# Patient Record
Sex: Male | Born: 1945
Health system: Southern US, Community
[De-identification: ages and names within clinical notes are randomized; demographics above are authoritative.]

## PROBLEM LIST (undated history)

## (undated) DIAGNOSIS — E7800 Pure hypercholesterolemia, unspecified: Secondary | ICD-10-CM

## (undated) DIAGNOSIS — E039 Hypothyroidism, unspecified: Secondary | ICD-10-CM

## (undated) DIAGNOSIS — Z8739 Personal history of other diseases of the musculoskeletal system and connective tissue: Secondary | ICD-10-CM

## (undated) DIAGNOSIS — J189 Pneumonia, unspecified organism: Secondary | ICD-10-CM

## (undated) DIAGNOSIS — M549 Dorsalgia, unspecified: Secondary | ICD-10-CM

## (undated) DIAGNOSIS — Z9289 Personal history of other medical treatment: Secondary | ICD-10-CM

## (undated) DIAGNOSIS — F329 Major depressive disorder, single episode, unspecified: Secondary | ICD-10-CM

## (undated) DIAGNOSIS — R296 Repeated falls: Secondary | ICD-10-CM

## (undated) DIAGNOSIS — I10 Essential (primary) hypertension: Secondary | ICD-10-CM

## (undated) DIAGNOSIS — F1021 Alcohol dependence, in remission: Secondary | ICD-10-CM

## (undated) DIAGNOSIS — I609 Nontraumatic subarachnoid hemorrhage, unspecified: Secondary | ICD-10-CM

## (undated) DIAGNOSIS — M546 Pain in thoracic spine: Secondary | ICD-10-CM

## (undated) DIAGNOSIS — F419 Anxiety disorder, unspecified: Secondary | ICD-10-CM

## (undated) DIAGNOSIS — K759 Inflammatory liver disease, unspecified: Secondary | ICD-10-CM

## (undated) DIAGNOSIS — M1990 Unspecified osteoarthritis, unspecified site: Secondary | ICD-10-CM

## (undated) DIAGNOSIS — I209 Angina pectoris, unspecified: Secondary | ICD-10-CM

## (undated) DIAGNOSIS — R202 Paresthesia of skin: Secondary | ICD-10-CM

## (undated) DIAGNOSIS — R0602 Shortness of breath: Secondary | ICD-10-CM

## (undated) DIAGNOSIS — Z720 Tobacco use: Secondary | ICD-10-CM

## (undated) DIAGNOSIS — G8929 Other chronic pain: Secondary | ICD-10-CM

## (undated) DIAGNOSIS — F32A Depression, unspecified: Secondary | ICD-10-CM

## (undated) DIAGNOSIS — R51 Headache: Secondary | ICD-10-CM

## (undated) DIAGNOSIS — R200 Anesthesia of skin: Secondary | ICD-10-CM

## (undated) DIAGNOSIS — R350 Frequency of micturition: Secondary | ICD-10-CM

## (undated) DIAGNOSIS — E119 Type 2 diabetes mellitus without complications: Secondary | ICD-10-CM

## (undated) DIAGNOSIS — R519 Headache, unspecified: Secondary | ICD-10-CM

## (undated) HISTORY — PX: EYE SURGERY: SHX253

## (undated) HISTORY — PX: CATARACT EXTRACTION: SUR2

---
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
## 1969-08-09 HISTORY — PX: CYSTOSCOPY: SHX5120

## 2011-06-08 ENCOUNTER — Inpatient Hospital Stay (HOSPITAL_COMMUNITY)
Admission: EM | Admit: 2011-06-08 | Discharge: 2011-06-11 | DRG: 313 | Discharge status: Against Medical Advice | Payer: Medicare Other | Attending: Family Medicine | Admitting: Family Medicine

## 2011-06-08 ENCOUNTER — Emergency Department (HOSPITAL_COMMUNITY): Payer: Medicare Other

## 2011-06-08 DIAGNOSIS — E039 Hypothyroidism, unspecified: Secondary | ICD-10-CM | POA: Diagnosis present

## 2011-06-08 DIAGNOSIS — E785 Hyperlipidemia, unspecified: Secondary | ICD-10-CM | POA: Diagnosis present

## 2011-06-08 DIAGNOSIS — E876 Hypokalemia: Secondary | ICD-10-CM | POA: Diagnosis present

## 2011-06-08 DIAGNOSIS — F17200 Nicotine dependence, unspecified, uncomplicated: Secondary | ICD-10-CM | POA: Diagnosis present

## 2011-06-08 DIAGNOSIS — F1210 Cannabis abuse, uncomplicated: Secondary | ICD-10-CM | POA: Diagnosis present

## 2011-06-08 DIAGNOSIS — Z8249 Family history of ischemic heart disease and other diseases of the circulatory system: Secondary | ICD-10-CM

## 2011-06-08 DIAGNOSIS — R5381 Other malaise: Secondary | ICD-10-CM | POA: Diagnosis present

## 2011-06-08 DIAGNOSIS — R5383 Other fatigue: Secondary | ICD-10-CM | POA: Diagnosis present

## 2011-06-08 DIAGNOSIS — N400 Enlarged prostate without lower urinary tract symptoms: Secondary | ICD-10-CM | POA: Diagnosis present

## 2011-06-08 DIAGNOSIS — N289 Disorder of kidney and ureter, unspecified: Secondary | ICD-10-CM | POA: Diagnosis present

## 2011-06-08 DIAGNOSIS — F1010 Alcohol abuse, uncomplicated: Secondary | ICD-10-CM | POA: Diagnosis present

## 2011-06-08 DIAGNOSIS — R0789 Other chest pain: Principal | ICD-10-CM | POA: Diagnosis present

## 2011-06-08 DIAGNOSIS — R079 Chest pain, unspecified: Secondary | ICD-10-CM

## 2011-06-08 DIAGNOSIS — I10 Essential (primary) hypertension: Secondary | ICD-10-CM | POA: Diagnosis present

## 2011-06-08 LAB — CBC
MCH: 32.9 pg (ref 26.0–34.0)
MCHC: 35.1 g/dL (ref 30.0–36.0)
Platelets: 191 10*3/uL (ref 150–400)

## 2011-06-08 LAB — POCT I-STAT, CHEM 8
Calcium, Ion: 1.08 mmol/L — ABNORMAL LOW (ref 1.12–1.32)
Creatinine, Ser: 1.6 mg/dL — ABNORMAL HIGH (ref 0.50–1.35)
Glucose, Bld: 260 mg/dL — ABNORMAL HIGH (ref 70–99)
Hemoglobin: 13.9 g/dL (ref 13.0–17.0)
TCO2: 19 mmol/L (ref 0–100)

## 2011-06-08 LAB — TROPONIN I: Troponin I: <0.3 ng/mL (ref <0.30)

## 2011-06-08 MED ORDER — IOHEXOL 350 MG/ML SOLN
80.0000 mL | Freq: Once | INTRAVENOUS | Status: AC | PRN
  Administered 2011-06-08: 80 mL via INTRAVENOUS

## 2011-06-09 ENCOUNTER — Inpatient Hospital Stay (HOSPITAL_COMMUNITY): Payer: Medicare Other

## 2011-06-09 LAB — CARDIAC PANEL(CRET KIN+CKTOT+MB+TROPI)
CK, MB: 2.9 ng/mL (ref 0.3–4.0)
Relative Index: 1.6 (ref 0.0–2.5)
Relative Index: 1.7 (ref 0.0–2.5)
Troponin I: <0.3 ng/mL (ref <0.30)
Troponin I: <0.3 ng/mL (ref <0.30)

## 2011-06-09 LAB — HEMOGLOBIN A1C: Hgb A1c MFr Bld: 6 % — ABNORMAL HIGH (ref <5.7)

## 2011-06-09 LAB — CBC
HCT: 36 % — ABNORMAL LOW (ref 39.0–52.0)
Hemoglobin: 13 g/dL (ref 13.0–17.0)
MCHC: 36.1 g/dL — ABNORMAL HIGH (ref 30.0–36.0)

## 2011-06-09 LAB — COMPREHENSIVE METABOLIC PANEL
ALT: 9 U/L (ref 0–53)
AST: 19 U/L (ref 0–37)
Albumin: 3.1 g/dL — ABNORMAL LOW (ref 3.5–5.2)
Alkaline Phosphatase: 69 U/L (ref 39–117)
Glucose, Bld: 72 mg/dL (ref 70–99)
Potassium: 4 mEq/L (ref 3.5–5.1)
Sodium: 139 mEq/L (ref 135–145)
Total Protein: 6.9 g/dL (ref 6.0–8.3)

## 2011-06-09 LAB — LIPID PANEL
LDL Cholesterol: 60 mg/dL (ref 0–99)
Total CHOL/HDL Ratio: 1.8 RATIO
VLDL: 8 mg/dL (ref 0–40)

## 2011-06-09 LAB — DIFFERENTIAL
Basophils Absolute: 0 10*3/uL (ref 0.0–0.1)
Lymphocytes Relative: 32 % (ref 12–46)
Monocytes Absolute: 0.8 10*3/uL (ref 0.1–1.0)
Neutro Abs: 4.2 10*3/uL (ref 1.7–7.7)

## 2011-06-09 LAB — MAGNESIUM: Magnesium: 2.2 mg/dL (ref 1.5–2.5)

## 2011-06-09 LAB — TROPONIN I: Troponin I: <0.3 ng/mL (ref <0.30)

## 2011-06-10 DIAGNOSIS — R079 Chest pain, unspecified: Secondary | ICD-10-CM

## 2011-06-11 ENCOUNTER — Other Ambulatory Visit (HOSPITAL_COMMUNITY): Payer: Medicare Other

## 2011-06-11 ENCOUNTER — Inpatient Hospital Stay (HOSPITAL_COMMUNITY): Payer: Medicare Other

## 2011-06-11 DIAGNOSIS — R079 Chest pain, unspecified: Secondary | ICD-10-CM

## 2011-06-11 LAB — T3: T3, Total: 72.7 ng/dl — ABNORMAL LOW (ref 80.0–204.0)

## 2011-06-11 MED ORDER — TECHNETIUM TC 99M TETROFOSMIN IV KIT
30.0000 | PACK | Freq: Once | INTRAVENOUS | Status: AC | PRN
  Administered 2011-06-11: 30 via INTRAVENOUS

## 2011-06-11 MED ORDER — TECHNETIUM TC 99M TETROFOSMIN IV KIT
10.0000 | PACK | Freq: Once | INTRAVENOUS | Status: AC | PRN
  Administered 2011-06-11: 10 via INTRAVENOUS

## 2011-06-15 NOTE — Consult Note (Signed)
NAME:  Dakota Stewart, Dakota Stewart NO.:  [id number]  MEDICAL RECORD NO.:  [id number]  LOCATION:  3311                         FACILITY:  [facility]  PHYSICIAN:  Natasha Bence, MD       DATE OF BIRTH:  1946-05-08  DATE OF CONSULTATION:  06/08/2011 DATE OF DISCHARGE:                                CONSULTATION   CONSULTING PHYSICIAN:  [facility].  CHIEF COMPLAINT:  Chest pain.  HISTORY OF PRESENT ILLNESS:  Dakota Stewart is a 65 year old black male who presented with multiple complaints including substernal chest pain and left-sided weakness.  He reports that his chest pain is substernal to somewhat left-sided nature.  It does not radiate.  It is associated with sweats and a feeling of a skipped beat in his chest.  He reports the pains have happened before several times.  They typical last 20 minutes to an hour.  Tonight, it lasted approximately 20-30 minutes and was relieved with sublingual nitroglycerin.  He reports these are brought on by stressful situations.  They tonight happened when he was sitting around.  He has had several minor chest pains while sitting in the bed on the monitor.  He does report occasional exertional chest pain over the last month or two.  He reports he can walk a fair amount, but he has been having slightly more dyspnea than usual and some times he has to stop due to chest discomfort.  Tonight, his chest discomfort episode was also associated with left arm tingling and left leg tingling and weakness.  He reports he does feel stressed.  He is not having any associated shortness of breath this evening.  He denies any lightheadedness or syncope.  He has not had any PND or orthopnea.  He denies any bleeding.  Otherwise, his review of systems was negative.  PAST MEDICAL HISTORY: 1. Likely hypertension. 2. Dyslipidemia. 3. BPH.  PAST SURGICAL HISTORY:  He has had prostate surgery.  SOCIAL HISTORY:  He smokes 1/2 pack cigarettes a day, drinks at  least two 40-ounce beers a day and some liquor.  He smokes marijuana.  FAMILY HISTORY:  Mom and dad both had coronary artery disease in their 43s and 39s.  ALLERGIES:  No known drug allergies.  MEDICATIONS:  He is on no medications currently.  PHYSICAL EXAMINATION:  VITAL SIGNS:  Temperature is 98.4.  Pulse of 84 and regular.  Respiratory rate is 16.  O2 sats 99% on room air.  Blood pressure 111/69.  He is a well-developed white male in no apparent distress. EYES:  Anicteric sclerae.  Pupils are equally round and reactive. NECK:  He has no carotid bruits.  Jugular venous pressure within normal limits. LUNGS:  Clear to auscultation bilaterally. CARDIOVASCULAR:  Regular rhythm.  No murmurs, rubs, or gallops. ABDOMEN:  Soft, nontender, and nondistended. EXTREMITIES:  Warm.  No edema.  Symmetrical pulses.  EKG, initial one showed sinus mechanism with heart rate of 86, nonspecific ST changes.  Repeat EKG after my exam which was largely unchanged.  He had a chest CT done due to his continued chest pain in the emergency room which showed no evidence of dissection.  Also, he had a head CT that  showed no acute abnormalities.  LABORATORY DATA:  White count 9.5, hematocrit 37, and platelet count 191.  Sodium 139, potassium 3.1, chloride 105, creatinine 1.6, BUN 16, and glucose 260.  Troponin was within normal limits.  IMPRESSION AND PLAN:  This is a 65 year old black male with probable hypertension, dyslipidemia, also a smoker with a family history of coronary artery disease, although his chest pain today seems somewhat atypical and vague in nature.  He does describe what sounds like exertional angina over the last few weeks.  His pain tonight is somewhat vague and also associated with left arm tingling and left leg tingling and weakness.  He says these pains were precipitated by stress.  His electrocardiogram and cardiac enzymes were unrevealing for an ischemic episode at this point in  time.  I agree with admitting him for observation on telemetry.  He is also being admitted for a TIA workup. I agree with aspirin daily in addition to beta-blocker and statin. Depending on how his cardiac enzymes do tonight, we will likely recommend a cardiac stress testing.  He does describe this chest pain as skipped beats and while I was examining he did have several episodes of PVCs which may be accounting for some of his discomfort.  Otherwise, no further recommendations at this point in time.  We will continue to follow him.  Please call with any questions.          ______________________________ Natasha Bence, MD     MH/MEDQ  D:  06/09/2011  T:  06/09/2011  Job:  [id number]  Electronically Signed by Natasha Bence MD on 06/15/2011 07:33:42 PM

## 2011-06-20 NOTE — Discharge Summary (Signed)
NAME:  DONTREAL, Dakota Stewart NO.:  [id number]  MEDICAL RECORD NO.:  [id number]  LOCATION:  3311                         FACILITY:  [facility]  PHYSICIAN:  Brendia Sacks, MD    DATE OF BIRTH:  29-May-1946  DATE OF ADMISSION:  06/08/2011 DATE OF DISCHARGE:  06/11/2011                              DISCHARGE SUMMARY   AGAINST MEDICAL ADVICE DISCHARGE SUMMARY.  DISCHARGE DIAGNOSES: 1. Chest pain. 2. Functional intermittent weakness, non-neurologic. 3. Hypertension. 4. Cigarette smoker.  HISTORY OF PRESENT ILLNESS:  This is a 65 year old male who presented to the emergency room with chest pain.  He also described neurologic symptoms.  He was admitted for further evaluation and treatment.  HOSPITAL COURSE: 1. Chest pain.  The patient was admitted and ruled out with serial     cardiac enzymes.  He was seen in consultation with Cardiology.  A 2-     D echocardiogram was performed with the results being quite     reassuring.  Cardiology recommended nuclear medicine stress test     which was obtained, however, results are pending at this time.  The     patient insisted on discharge.  He therefore left against medical     advice before the results of testing were known and any further     recommendations could be made. 2. Focal intermittent neurologic symptoms.  The patient was seen in     consultation with Neurology.  Case was discussed with Dr. Hoy Morn     who specifically believes this is non-neurologic in nature and     recommended no further evaluation.  MRI of the brain was     unremarkable. 3. Hypertension.  This remained stable. 4. Alcohol abuse and cigarette smoker.  Social work was consulted.  IMAGING: 1. CT of the head was negative for acute intracranial hemorrhage or     infarct. 2. CT angiogram of the chest, abdomen, and pelvis - nonocclusive     atherosclerotic changes in the abdominal aorta and branch vessels.     No evidence of aortic aneurysm or  dissection. 3. CT angiogram of the chest, no evidence of thoracic aortic aneurysm     or dissection.  No significant pulmonary embolus.  No evidence of     acute pulmonary disease. 4. MRI of the brain:  No acute infarct.  No intracranial mass lesion     detected. 5. Nuclear medicine stress test:    1.  No significant reversibility.   2.  Normal ejection fraction of 51%.   3.  Normal wall motion.   MICROBIOLOGY:  None.  ANCILLARY STUDIES:  A 2-D echocardiogram showed left ventricular ejection fraction of 55-60%, wall motion was normal.  There were no regional wall motion abnormalities.  PERTINENT LABORATORY STUDIES: 1. LDL was 160. 2. Hemoglobin A1c was 6.0. 3. TSH was 19.052. 4. T3 was 72.7 which is slightly low. 5. Free T4 was within normal limits at 1.0. 6. Cardiac enzymes were negative.  DISPOSITION:  The patient left against medical advice.     Brendia Sacks, MD     DG/MEDQ  D:  06/11/2011  T:  06/12/2011  Job:  [id number]  Electronically Signed by Reuel Boom  Raha Tennison  on 06/20/2011 11:19:05 AM

## 2011-06-24 NOTE — H&P (Signed)
NAME:  Dakota Stewart, Dakota Stewart            ACCOUNT NO.:  [id number]  MEDICAL RECORD NO.:  [id number]  LOCATION:  3311                         FACILITY:  [facility]  PHYSICIAN:  Kestrel Mis, DO         DATE OF BIRTH:  Nov 27, 1946  DATE OF ADMISSION:  06/08/2011 DATE OF DISCHARGE:                             HISTORY & PHYSICAL   CHIEF COMPLAINT:  Chest pain.  HISTORY OF PRESENT ILLNESS:  The patient is a 65 year old male who states he was sitting down to eat [place] food tonight.  He states he had taken a couple of bites of food and he had the sudden onset of flushing feeling, left-sided precordial chest pain without radiation and just drenching sweats.  He also had some nausea with it.  He did not vomit.  He stated the chest pain was 10/10.  Overtime, it varied between 5/10-10/10.  It did not radiate to his arm or to his jaw or to his neck. There were no palliative or provocative factors.  He states that it did get better about 25 minutes after he was given an aspirin and a sublingual nitroglycerin in the ER.  The patient also had at the same time onset of left upper and left lower extremity weakness.  He has had this intermittently over the past 6 months, sometimes to the degree that he cannot even walk without falling and it had occurred tonight with his chest pain.  The patient has been evaluated by Dr. Buzzy Han with Neurology and recommendation was made for a TIA workup.  The patient has moved to [facility] from [place], [place] where he saw Dr. Morrie Sheldon.  He has not seen Dr. Morrie Sheldon for 9 months.  He states that he had been on a number of medications, but that he has just stopped taking them because they made him nauseated and because he does not have a doctor to resolve them.  He has not sought out a new primary care physician here.  PAST MEDICAL HISTORY:  Significant for: 1. Hypertension. 2. Hyperlipidemia. 3. Hypothyroidism. 4. Benign prostatic hypertrophy. 5. Left ventricular  hypertrophy.  PAST SURGICAL HISTORY:  Significant for TURP.  SOCIAL HISTORY:  The patient states he smokes about 1/2 a pack per day and he drinks heavily.  He states that if there is anything around to drink he will drink it all.  Denies recreational drug use.  MEDICATIONS:  Again, the patient states he knows he is supposed to be on a number of medications, but he takes nothing.  ALLERGIES:  NKDA.  FAMILY HISTORY:  Mother and father both died of coronary artery disease in their 39s.  REVIEW OF SYSTEMS:  CONSTITUTIONAL:  Negative for fever.  Negative for chills.  Positive for weakness.  Positive for fatigue.  CNS:  Positive for left-sided upper and lower extremity weakness intermittently over the past 6 months.  Positive for intermittent difficulty with walking. Negative for seizure.  Negative for loss of consciousness.  ENT:  No nasal congestion, no throat pain, no coryza.  CARDIOVASCULAR:  Positive for chest pain.  Negative for palpitations.  Negative for orthopnea. RESPIRATORY:  No cough, no shortness of breath, no wheezing. GASTROINTESTINAL:  Positive  for nausea with chest pain.  No vomiting, no constipation, no diarrhea.  GENITOURINARY:  Positive for some urinary hesitancy.  No dysuria, no hematuria.  RENAL:  No flank pain, no swelling, no pruritus.  SKIN:  No rashes, no sores, no lesions. HEMATOLOGICAL:  No easy bruising, no purpura, no clots.  LYMPH:  No lymphadenopathy.  No painful lymph nodes or specific lymph swelling. PSYCHIATRIC:  No anxiety, no depression, no insomnia.  PHYSICAL EXAMINATION:  VITAL SIGNS:  Temperature 98.4, pulse 91, respiratory rate 16, blood pressure 111/69, and O2 sat 99% on room air. GENERAL:  The patient is an elderly male who is awake, alert, and ordered x3.  He is able to give fair history. EYES:  Pupils are equal, round, and reactive to light and accommodation. External ocular movements are intact.  Sclerae are nonicteric  and noninjected. MOUTH:  Oral mucosa is dry.  No lesions.  No sores.  Pharynx clean.  No erythema.  No exudate. NECK:  Negative for JVD.  Negative for thyromegaly.  Negative for lymphadenopathy. HEART:  Regular rate and rhythm at 80 beats per minute without murmur, ectopy, or gallops.  No lateral PMI.  No thrills. LUNGS:  Clear to auscultation bilaterally without wheezes, rales, or rhonchi.  No increase work of breathing.  No tactile fremitus. ABDOMEN:  Soft, nontender, and nondistended.  Positive bowel sounds.  No hepatosplenomegaly.  No hernias palpated. EXTREMITIES:  Negative for cyanosis, clubbing, or edema.  The patient has greatly diminished dorsalis pedis and popliteal pulses bilaterally. No carotid bruits bilaterally. NEUROLOGICAL:  Cranial nerves II-XII are grossly intact.  Motor and sensory intact.  The patient does have 4/5 weakness in his left upper and lower extremity as compared to 5/5 muscle strength on his right. Proprioception is intact.  LABORATORY DATA:  WBC is 9.5, hemoglobin 13.9, hematocrit 41.0, and platelets 191.  Sodium 139, potassium is 3.1, chloride 105, BUN is 16, creatinine is 1.60, and glucose is 260.  CT of the chest was negative for thoracic aortic aneurysm or dissection.  No PE, no pulmonary disease CTA of the abdomen showed nonocclusive atherosclerotic changes in the abdominal aorta, but no signs of AAA or dissection.  CT of head shows no acute intracranial hemorrhage, mass lesion, or acute infarct.  EKG shows normal sinus rhythm.  There may be some slight ST-segment elevation in V3, but this is really less clear in V2 and there is one in V4. Troponin is less than 0.30.  ASSESSMENT: 1. Chest pain.  Differential diagnosis includes acute coronary     syndrome, esophageal spasm or esophagitis. 2. Transient ischemic attacks.  This has been evaluated by Neurology.     Their recommendation is for a TIA workup. 3. Hypokalemia. 4. Acute renal  insufficiency. 5. Hyperlipidemia. 6. Alcohol abuse. 7. Benign prostatic hypertrophy.  PLAN: 1. Rule out MI by EKG and enzyme criteria rate. 2. Consult Cardiology to rule out acute coronary syndrome. 3. Statin cautiously with the patient's renal failure. 4. Beta-blocker. 5. Aspirin. 6. TIA workup 7. Supplement potassium. 8. Cover for DTs. 9. Nicotine patch.  I have spent 1 hour on this admission.          ______________________________ Fran Lowes, DO     AS/MEDQ  D:  06/08/2011  T:  06/09/2011  Job:  [id number]  Electronically Signed by Fran Lowes DO on 06/24/2011 01:46:14 PM

## 2011-07-14 NOTE — Consult Note (Signed)
NAME:  Dakota, Stewart NO.:  [id number]  MEDICAL RECORD NO.:  [id number]  LOCATION:                                 FACILITY:  PHYSICIAN:  Mont Dutton, MD    DATE OF BIRTH:  29-Sep-1946  DATE OF CONSULTATION:  06/08/2011 DATE OF DISCHARGE:                                CONSULTATION   REQUESTING PHYSICIAN:  Dakota Sou, MD.  REASON FOR CONSULTATION:  Evaluate for stroke.  HISTORY OF PRESENT ILLNESS:  Dakota Stewart is a 65 year old African American male with past medical history of hypertension, hyperlipidemia, and tobacco abuse who presents with a chief complaint chest pain.  He states this was started this afternoon while at lunch and improved after receiving nitroglycerin by EMS.  While he is in the emergency department, he also reported that he has had intermittent left-sided weakness for the past 6 months.  He states that this occurs every 2-3 days and lasts 5-10 minutes each time.  When it occurs, he has trouble holding objects in his left hand and sometimes his left leg gives out on him resulting in a fall.  He states that in between these events he does not feel that his strength is strong as the right side.  He states that he has had left face numbness for the past 3 weeks.  He has experienced left upper and left lower extremity numbness and tingling with weakness. He also complains of intermittent blurry vision on the left side of his visual field.  He has never experienced any speech deficits.  He does not have any known history of a stroke.  He has not presented these complaints to a medical personnel before.  He states that he is supposed to take aspirin, blood pressure medicine, and cholesterol medicine, but he did not take it because "I do not like to take medications."  REVIEW OF SYSTEMS:  Complete 14-point review of systems is negative other than what was mentioned in the history of present illness.  PAST MEDICAL HISTORY: 1.  Hypertension. 2. Hyperlipidemia. 3. Tobacco use. 4. "Thyroid problems." 5. BPH.  HOME MEDICATIONS:  None.  SOCIAL HISTORY:  He smokes half pack a day.  He drinks approximately 4 beers 3-4 times per week.  He smokes marijuana.  He lives alone.  He was previously in home, and he is currently residing in a hotel.  FAMILY HISTORY:  Noncontributory.  PHYSICAL EXAMINATION:  GENERAL:  Well, in no apparent distress. CARDIOVASCULAR:  Regular rate and rhythm. RESPIRATORY:  Clear anteriorly. ABDOMEN:  Soft. SKIN:  No rashes or lesions. MENTAL STATUS:  Alert and oriented x4.  Name and repetition intact. Speech is fluent.  No dysarthria. CRANIAL NERVES:  Pupils are equal, round, and reactive to light.  Full visual fields.  Extraocular movements intact.  Decreased light to touch and pinprick on the left side of his face.  Facial expression symmetric. Shoulder strength 5/5 bilaterally.  Tongue midline. MOTOR:  No pronator drift is present.  Give-way weakness is present in the left upper and left lower extremity and it is, therefore, difficultto fully assess his actual strength. DEEP TENDON REFLEXES:  A 2+/4 throughout with toes downgoing bilaterally. COORDINATION:  No  ataxia with bilateral finger-to-nose. SENSATION:  Decreased light to touch and temperature in his left upper and left lower extremity. GAIT:  Not tested.  LABORATORY DATA: 1. Cardiac enzymes are negative. 2. CBC is significant for mild anemia with hematocrit of 36.8. 3. BMP is significant for elevation in creatinine of 1.6 and     hyperglycemia of 260 as well as hypokalemia with a potassium of     3.1.  IMAGING:  Noncontrast head CT reveals no acute intracranial abnormality.  ASSESSMENT AND PLAN:  Dakota Stewart is a 65 year old African American male with a past medical history of tobacco abuse, hypertension, hyperlipidemia, and possibly alcohol abuse as well who presents with a chief complaint of chest pain and has also  reported intermittent left upper and lower extremity weakness for the past 6 months as well as ongoing left face numbness for the past 3 weeks.  His exam is notable for sensory deficits on the left side.  There is no pronator drift noted; however, there is weakness on strength testing out of proportion would be expected with drift testing.  This is largely consistent with a give-way weakness and it is difficult to assess if there is true weakness present.  His history is concerning for intermittent episodes of transient ischemic attacks or a remote stroke with residual deficits.  PLAN: 1. Admit to the Hospitalist Service. 2. Obtain MRI of the brain without contrast. 3. Obtain carotid Dopplers. 4. Obtain 2-D echocardiogram with a bubble study. 5. PT and OT. 6. Bedside swallow eval. 7. Initiate aspirin 81 mg daily. 8. Check a lipid panel and homocystine in the morning. 9. If LDL is greater than 70, initiate statin medication. 10.Anticipate initiating a blood pressure medication if blood pressure     is consistently greater than 140/90.  Thank you for this consultation.  We will continue to follow.         ______________________________ Mont Dutton, MD    CS/MEDQ  D:  06/08/2011  T:  06/09/2011  Job:  [id number]  Electronically Signed by Mont Dutton MD on 07/14/2011 06:49:29 AM

## 2011-12-10 DIAGNOSIS — Z9289 Personal history of other medical treatment: Secondary | ICD-10-CM

## 2011-12-10 DIAGNOSIS — I609 Nontraumatic subarachnoid hemorrhage, unspecified: Secondary | ICD-10-CM

## 2011-12-10 HISTORY — DX: Nontraumatic subarachnoid hemorrhage, unspecified: I60.9

## 2011-12-10 HISTORY — DX: Personal history of other medical treatment: Z92.89

## 2011-12-21 ENCOUNTER — Encounter (HOSPITAL_COMMUNITY): Payer: Self-pay | Admitting: Radiology

## 2011-12-21 ENCOUNTER — Encounter (HOSPITAL_COMMUNITY): Payer: Self-pay | Admitting: Anesthesiology

## 2011-12-21 ENCOUNTER — Emergency Department (HOSPITAL_COMMUNITY): Payer: Medicare Other

## 2011-12-21 ENCOUNTER — Inpatient Hospital Stay (HOSPITAL_COMMUNITY)
Admission: EM | Admit: 2011-12-21 | Discharge: 2012-01-07 | DRG: 003 | Disposition: A | Discharge status: Other Acute Inpatient Hospital | Payer: Medicare Other | Source: Ambulatory Visit | Attending: General Surgery | Admitting: General Surgery

## 2011-12-21 ENCOUNTER — Encounter (HOSPITAL_COMMUNITY)
Admission: EM | Disposition: A | Discharge status: Nursing Home (Includes ICF) | Payer: Self-pay | Source: Ambulatory Visit

## 2011-12-21 ENCOUNTER — Emergency Department (HOSPITAL_COMMUNITY): Payer: Medicare Other | Admitting: Anesthesiology

## 2011-12-21 DIAGNOSIS — S0240CA Maxillary fracture, right side, initial encounter for closed fracture: Secondary | ICD-10-CM | POA: Diagnosis present

## 2011-12-21 DIAGNOSIS — S02401A Maxillary fracture, unspecified, initial encounter for closed fracture: Secondary | ICD-10-CM | POA: Diagnosis present

## 2011-12-21 DIAGNOSIS — Y92410 Unspecified street and highway as the place of occurrence of the external cause: Secondary | ICD-10-CM

## 2011-12-21 DIAGNOSIS — S0292XA Unspecified fracture of facial bones, initial encounter for closed fracture: Secondary | ICD-10-CM

## 2011-12-21 DIAGNOSIS — S066XAA Traumatic subarachnoid hemorrhage with loss of consciousness status unknown, initial encounter: Secondary | ICD-10-CM | POA: Diagnosis present

## 2011-12-21 DIAGNOSIS — H27119 Subluxation of lens, unspecified eye: Secondary | ICD-10-CM | POA: Diagnosis present

## 2011-12-21 DIAGNOSIS — D62 Acute posthemorrhagic anemia: Secondary | ICD-10-CM | POA: Diagnosis not present

## 2011-12-21 DIAGNOSIS — J156 Pneumonia due to other aerobic Gram-negative bacteria: Secondary | ICD-10-CM | POA: Diagnosis not present

## 2011-12-21 DIAGNOSIS — K560 Paralytic ileus: Secondary | ICD-10-CM | POA: Diagnosis not present

## 2011-12-21 DIAGNOSIS — E46 Unspecified protein-calorie malnutrition: Secondary | ICD-10-CM | POA: Diagnosis not present

## 2011-12-21 DIAGNOSIS — S02402A Zygomatic fracture, unspecified, initial encounter for closed fracture: Secondary | ICD-10-CM | POA: Diagnosis present

## 2011-12-21 DIAGNOSIS — J95821 Acute postprocedural respiratory failure: Secondary | ICD-10-CM | POA: Diagnosis present

## 2011-12-21 DIAGNOSIS — I609 Nontraumatic subarachnoid hemorrhage, unspecified: Secondary | ICD-10-CM

## 2011-12-21 DIAGNOSIS — S0280XA Fracture of other specified skull and facial bones, unspecified side, initial encounter for closed fracture: Secondary | ICD-10-CM | POA: Diagnosis present

## 2011-12-21 DIAGNOSIS — S066X9A Traumatic subarachnoid hemorrhage with loss of consciousness of unspecified duration, initial encounter: Secondary | ICD-10-CM

## 2011-12-21 DIAGNOSIS — J9600 Acute respiratory failure, unspecified whether with hypoxia or hypercapnia: Secondary | ICD-10-CM | POA: Diagnosis present

## 2011-12-21 DIAGNOSIS — S01501A Unspecified open wound of lip, initial encounter: Secondary | ICD-10-CM | POA: Diagnosis present

## 2011-12-21 DIAGNOSIS — E871 Hypo-osmolality and hyponatremia: Secondary | ICD-10-CM | POA: Diagnosis not present

## 2011-12-21 DIAGNOSIS — S0285XA Fracture of orbit, unspecified, initial encounter for closed fracture: Secondary | ICD-10-CM | POA: Diagnosis present

## 2011-12-21 DIAGNOSIS — F1010 Alcohol abuse, uncomplicated: Secondary | ICD-10-CM | POA: Diagnosis present

## 2011-12-21 DIAGNOSIS — R4020 Unspecified coma: Secondary | ICD-10-CM | POA: Diagnosis not present

## 2011-12-21 DIAGNOSIS — IMO0002 Reserved for concepts with insufficient information to code with codable children: Secondary | ICD-10-CM

## 2011-12-21 DIAGNOSIS — E876 Hypokalemia: Secondary | ICD-10-CM | POA: Diagnosis not present

## 2011-12-21 DIAGNOSIS — Z794 Long term (current) use of insulin: Secondary | ICD-10-CM

## 2011-12-21 DIAGNOSIS — S0510XA Contusion of eyeball and orbital tissues, unspecified eye, initial encounter: Secondary | ICD-10-CM | POA: Diagnosis present

## 2011-12-21 DIAGNOSIS — S0240DA Maxillary fracture, left side, initial encounter for closed fracture: Secondary | ICD-10-CM | POA: Diagnosis present

## 2011-12-21 DIAGNOSIS — S02109A Fracture of base of skull, unspecified side, initial encounter for closed fracture: Principal | ICD-10-CM | POA: Diagnosis present

## 2011-12-21 DIAGNOSIS — S022XXA Fracture of nasal bones, initial encounter for closed fracture: Secondary | ICD-10-CM | POA: Diagnosis present

## 2011-12-21 DIAGNOSIS — S0230XA Fracture of orbital floor, unspecified side, initial encounter for closed fracture: Secondary | ICD-10-CM | POA: Diagnosis present

## 2011-12-21 DIAGNOSIS — S0180XA Unspecified open wound of other part of head, initial encounter: Secondary | ICD-10-CM | POA: Diagnosis present

## 2011-12-21 DIAGNOSIS — Z79899 Other long term (current) drug therapy: Secondary | ICD-10-CM

## 2011-12-21 DIAGNOSIS — H11419 Vascular abnormalities of conjunctiva, unspecified eye: Secondary | ICD-10-CM | POA: Diagnosis present

## 2011-12-21 DIAGNOSIS — J151 Pneumonia due to Pseudomonas: Secondary | ICD-10-CM | POA: Diagnosis not present

## 2011-12-21 DIAGNOSIS — J1569 Pneumonia due to other gram-negative bacteria: Secondary | ICD-10-CM | POA: Diagnosis not present

## 2011-12-21 DIAGNOSIS — F10929 Alcohol use, unspecified with intoxication, unspecified: Secondary | ICD-10-CM | POA: Diagnosis present

## 2011-12-21 HISTORY — PX: LACERATION REPAIR: SHX5284

## 2011-12-21 HISTORY — PX: ORIF FACIAL FRACTURE: SHX2118

## 2011-12-21 LAB — TYPE AND SCREEN
ABO/RH(D): O POS
Antibody Screen: NEGATIVE
Unit division: 0
Unit division: 0

## 2011-12-21 LAB — URINALYSIS, MICROSCOPIC ONLY
Ketones, ur: NEGATIVE mg/dL
Leukocytes, UA: NEGATIVE
Nitrite: NEGATIVE
Protein, ur: NEGATIVE mg/dL
Urobilinogen, UA: 0.2 mg/dL (ref 0.0–1.0)

## 2011-12-21 LAB — POCT I-STAT, CHEM 8
BUN: 17 mg/dL (ref 6–23)
Calcium, Ion: 1.07 mmol/L — ABNORMAL LOW (ref 1.12–1.32)
Chloride: 104 mEq/L (ref 96–112)
Creatinine, Ser: 1.3 mg/dL (ref 0.50–1.35)
Glucose, Bld: 140 mg/dL — ABNORMAL HIGH (ref 70–99)

## 2011-12-21 LAB — COMPREHENSIVE METABOLIC PANEL
ALT: 13 U/L (ref 0–53)
AST: 23 U/L (ref 0–37)
CO2: 18 mEq/L — ABNORMAL LOW (ref 19–32)
Chloride: 101 mEq/L (ref 96–112)
Creatinine, Ser: 0.96 mg/dL (ref 0.50–1.35)
GFR calc non Af Amer: 84 mL/min — ABNORMAL LOW (ref >90)
Glucose, Bld: 140 mg/dL — ABNORMAL HIGH (ref 70–99)
Sodium: 137 mEq/L (ref 135–145)
Total Bilirubin: 0.3 mg/dL (ref 0.3–1.2)

## 2011-12-21 LAB — ETHANOL: Alcohol, Ethyl (B): 186 mg/dL — ABNORMAL HIGH (ref 0–11)

## 2011-12-21 LAB — CBC
MCH: 33.5 pg (ref 26.0–34.0)
MCHC: 35.7 g/dL (ref 30.0–36.0)
Platelets: 202 10*3/uL (ref 150–400)

## 2011-12-21 SURGERY — OPEN REDUCTION INTERNAL FIXATION (ORIF) FACIAL FRACTURE
Anesthesia: General | Site: Face | Wound class: Dirty or Infected

## 2011-12-21 MED ORDER — FENTANYL CITRATE 0.05 MG/ML IJ SOLN
INTRAMUSCULAR | Status: AC
  Filled 2011-12-21: qty 4

## 2011-12-21 MED ORDER — ROCURONIUM BROMIDE 100 MG/10ML IV SOLN
INTRAVENOUS | Status: DC | PRN
  Administered 2011-12-21: 50 mg via INTRAVENOUS

## 2011-12-21 MED ORDER — LIDOCAINE-EPINEPHRINE 1 %-1:100000 IJ SOLN
INTRAMUSCULAR | Status: DC | PRN
  Administered 2011-12-21: 10 mL

## 2011-12-21 MED ORDER — VECURONIUM BROMIDE 10 MG IV SOLR
INTRAVENOUS | Status: DC | PRN
  Administered 2011-12-21 (×2): 5 mg via INTRAVENOUS

## 2011-12-21 MED ORDER — BSS IO SOLN
INTRAOCULAR | Status: DC | PRN
  Administered 2011-12-21: 1 via INTRAOCULAR

## 2011-12-21 MED ORDER — SODIUM CHLORIDE 0.9 % IV SOLN
10.0000 ug/h | INTRAVENOUS | Status: DC
  Administered 2011-12-21: 10 ug/h via INTRAVENOUS
  Filled 2011-12-21: qty 50

## 2011-12-21 MED ORDER — ETOMIDATE 2 MG/ML IV SOLN
INTRAVENOUS | Status: AC | PRN
  Administered 2011-12-21: 20 mg via INTRAVENOUS

## 2011-12-21 MED ORDER — CEFAZOLIN SODIUM 1-5 GM-% IV SOLN
INTRAVENOUS | Status: DC | PRN
  Administered 2011-12-21: 2 g via INTRAVENOUS

## 2011-12-21 MED ORDER — DEXTROSE 5 % IV SOLN
INTRAVENOUS | Status: DC | PRN
  Administered 2011-12-21: 21:00:00 via INTRAVENOUS

## 2011-12-21 MED ORDER — SODIUM CHLORIDE 0.9 % IV SOLN
10.0000 mg | INTRAVENOUS | Status: DC | PRN
  Administered 2011-12-21: 10 ug/min via INTRAVENOUS

## 2011-12-21 MED ORDER — ONDANSETRON HCL 4 MG/2ML IJ SOLN
INTRAMUSCULAR | Status: DC | PRN
  Administered 2011-12-21: 4 mg via INTRAVENOUS

## 2011-12-21 MED ORDER — SODIUM CHLORIDE 0.9 % IV SOLN
2.0000 mg/h | INTRAVENOUS | Status: DC
  Administered 2011-12-21 – 2011-12-23 (×3): 2 mg/h via INTRAVENOUS
  Administered 2011-12-24 – 2011-12-25 (×2): 3 mg/h via INTRAVENOUS
  Administered 2011-12-25 – 2012-01-02 (×10): 2 mg/h via INTRAVENOUS
  Filled 2011-12-21 (×18): qty 10

## 2011-12-21 MED ORDER — FENTANYL CITRATE 0.05 MG/ML IJ SOLN
INTRAMUSCULAR | Status: DC | PRN
  Administered 2011-12-21: 100 ug via INTRAVENOUS
  Administered 2011-12-21: 50 ug via INTRAVENOUS
  Administered 2011-12-21 – 2011-12-22 (×4): 100 ug via INTRAVENOUS

## 2011-12-21 MED ORDER — CEFAZOLIN SODIUM 1-5 GM-% IV SOLN
INTRAVENOUS | Status: AC
  Filled 2011-12-21: qty 100

## 2011-12-21 MED ORDER — OXYMETAZOLINE HCL 0.05 % NA SOLN
NASAL | Status: DC | PRN
  Administered 2011-12-21 (×2): 1 via NASAL

## 2011-12-21 MED ORDER — PROPOFOL 10 MG/ML IV BOLUS
INTRAVENOUS | Status: AC | PRN
  Administered 2011-12-21: 1724 ug via INTRAVENOUS

## 2011-12-21 MED ORDER — MIDAZOLAM HCL 2 MG/2ML IJ SOLN
INTRAMUSCULAR | Status: AC
  Filled 2011-12-21: qty 6

## 2011-12-21 MED ORDER — MIDAZOLAM HCL 5 MG/5ML IJ SOLN
INTRAMUSCULAR | Status: AC | PRN
  Administered 2011-12-21: 2 mg via INTRAVENOUS

## 2011-12-21 MED ORDER — FENTANYL CITRATE 0.05 MG/ML IJ SOLN
INTRAMUSCULAR | Status: AC | PRN
  Administered 2011-12-21 (×2): 100 ug via INTRAVENOUS

## 2011-12-21 MED ORDER — SUCCINYLCHOLINE CHLORIDE 20 MG/ML IJ SOLN
INTRAMUSCULAR | Status: AC | PRN
  Administered 2011-12-21: 100 mg via INTRAVENOUS

## 2011-12-21 MED ORDER — LACTATED RINGERS IV SOLN
INTRAVENOUS | Status: DC | PRN
  Administered 2011-12-21 – 2011-12-22 (×3): via INTRAVENOUS

## 2011-12-21 MED ORDER — IOHEXOL 300 MG/ML  SOLN
100.0000 mL | Freq: Once | INTRAMUSCULAR | Status: AC | PRN
  Administered 2011-12-21: 100 mL via INTRAVENOUS

## 2011-12-21 SURGICAL SUPPLY — 66 items
BENZOIN TINCTURE PRP APPL 2/3 (GAUZE/BANDAGES/DRESSINGS) ×8 IMPLANT
BIT DRILL TWIST 1.3X5 (BIT) ×3
BIT DRILL TWIST 1.3X5MM (BIT) ×3 IMPLANT
BIT DRILL TWIST 1.3X79 (BIT) ×6 IMPLANT
BLADE SURG 15 STRL LF DISP TIS (BLADE) ×3 IMPLANT
BLADE SURG 15 STRL SS (BLADE) ×1
CANISTER SUCTION 2500CC (MISCELLANEOUS) ×4 IMPLANT
CLEANER TIP ELECTROSURG 2X2 (MISCELLANEOUS) ×4 IMPLANT
CLOTH BEACON ORANGE TIMEOUT ST (SAFETY) ×4 IMPLANT
CORDS BIPOLAR (ELECTRODE) IMPLANT
COVER SURGICAL LIGHT HANDLE (MISCELLANEOUS) ×4 IMPLANT
DRAIN CHANNEL 7F FF FLAT (WOUND CARE) IMPLANT
DRILL BIT TWIST 1.3X5MM (BIT) ×1
DRILL BIT TWIST 1.3X79MM (BIT) ×2
ELECT COATED BLADE 2.86 ST (ELECTRODE) ×4 IMPLANT
ELECT NEEDLE TIP 2.8 STRL (NEEDLE) ×4 IMPLANT
ELECT REM PT RETURN 9FT ADLT (ELECTROSURGICAL) ×4
ELECTRODE REM PT RTRN 9FT ADLT (ELECTROSURGICAL) ×3 IMPLANT
EVACUATOR SILICONE 100CC (DRAIN) IMPLANT
GAUZE SPONGE 4X4 16PLY XRAY LF (GAUZE/BANDAGES/DRESSINGS) ×8 IMPLANT
GLOVE BIO SURGEON STRL SZ7 (GLOVE) ×4 IMPLANT
GLOVE BIO SURGEON STRL SZ8 (GLOVE) ×4 IMPLANT
GLOVE BIOGEL M 7.0 STRL (GLOVE) ×4 IMPLANT
GOWN STRL NON-REIN LRG LVL3 (GOWN DISPOSABLE) ×4 IMPLANT
KIT BASIN OR (CUSTOM PROCEDURE TRAY) ×4 IMPLANT
KIT ROOM TURNOVER OR (KITS) ×4 IMPLANT
LOCATOR NERVE 3 VOLT (DISPOSABLE) IMPLANT
NEEDLE HYPO 25GX1X1/2 BEV (NEEDLE) ×4 IMPLANT
NS IRRIG 1000ML POUR BTL (IV SOLUTION) ×4 IMPLANT
PAD ARMBOARD 7.5X6 YLW CONV (MISCELLANEOUS) ×8 IMPLANT
PENCIL BUTTON HOLSTER BLD 10FT (ELECTRODE) ×4 IMPLANT
PLATE MID FACE 4H STRAIGHT (Plate) ×4 IMPLANT
PLATE MID FACE 5H 2MM 100D (Plate) ×4 IMPLANT
PLATE MID FACE 6H 8MM 100D LFT (Plate) ×4 IMPLANT
PLATE MID FACE 6H 8MM 100D RT (Plate) ×4 IMPLANT
PLATE ORBITAL RIM MID FACE (Plate) ×4 IMPLANT
PLATE UPPER FACE 24H STRAIGHT (Plate) ×4 IMPLANT
PLATE UPPER FACE 7H Y (Plate) ×4 IMPLANT
SCREW MID FACE 1.7X5MM SLF TAP (Screw) ×4 IMPLANT
SCREW MIDFACE 1.7X3MM SLF TAP (Screw) ×40 IMPLANT
SCREW MIDFACE 1.7X4MM SLF TAP (Screw) ×52 IMPLANT
SCREW UPPERFACE 1.2X3M SL (Screw) ×28 IMPLANT
SPLINT NASAL DOYLE BI-VL (GAUZE/BANDAGES/DRESSINGS) ×4 IMPLANT
SPONGE GAUZE 4X4 12PLY (GAUZE/BANDAGES/DRESSINGS) ×4 IMPLANT
STAPLER VISISTAT 35W (STAPLE) ×4 IMPLANT
SUT CHROMIC 3 0 SH 27 (SUTURE) ×12 IMPLANT
SUT ETHILON 3 0 PS 1 (SUTURE) ×4 IMPLANT
SUT ETHILON 5 0 CL P 3 (SUTURE) ×4 IMPLANT
SUT ETHILON 5 0 P 3 18 (SUTURE) ×3
SUT ETHILON 5 0 PS 2 18 (SUTURE) IMPLANT
SUT NYLON ETHILON 5-0 P-3 1X18 (SUTURE) ×9 IMPLANT
SUT SILK 2 0 FS (SUTURE) IMPLANT
SUT SILK 3 0 REEL (SUTURE) IMPLANT
SUT VIC AB 3-0 PS2 18 (SUTURE)
SUT VIC AB 3-0 PS2 18XBRD (SUTURE) IMPLANT
SUT VIC AB 4-0 P-3 18X BRD (SUTURE) ×6 IMPLANT
SUT VIC AB 4-0 P3 18 (SUTURE) ×2
SUT VICRYL 4-0 PS2 18IN ABS (SUTURE) ×4 IMPLANT
SYR BULB 3OZ (MISCELLANEOUS) ×4 IMPLANT
SYR CONTROL 10ML LL (SYRINGE) ×4 IMPLANT
TOWEL OR 17X24 6PK STRL BLUE (TOWEL DISPOSABLE) ×4 IMPLANT
TOWEL OR 17X26 10 PK STRL BLUE (TOWEL DISPOSABLE) ×4 IMPLANT
TOWEL OR NON WOVEN STRL DISP B (DISPOSABLE) ×4 IMPLANT
TRAY ENT MC OR (CUSTOM PROCEDURE TRAY) ×4 IMPLANT
WATER STERILE IRR 1000ML POUR (IV SOLUTION) IMPLANT
YANKAUER SUCT BULB TIP NO VENT (SUCTIONS) ×4 IMPLANT

## 2011-12-21 NOTE — ED Notes (Signed)
See trauma chart
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
## 2011-12-21 NOTE — Progress Notes (Signed)
Proximate timesPt arrived ED @1730  hrs
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
## 2011-12-21 NOTE — Preoperative (Signed)
Beta Blockers   Reason not to administer Beta Blockers:Not Applicable 

## 2011-12-21 NOTE — ED Notes (Signed)
Primary contact info for  Dakota Stewart: Daughter:  Essie Christine   [phone] [address]  [place], [place]

## 2011-12-21 NOTE — Anesthesia Procedure Notes (Signed)
Date/Time: 12/21/2011 10:00 PM Performed by: Wray Kearns A Pre-anesthesia Checklist: Patient identified, Timeout performed, Emergency Drugs available, Suction available and Patient being monitored Patient Re-evaluated:Patient Re-evaluated prior to inductionOxygen Delivery Method: Circle System Utilized Preoxygenation: Pre-oxygenation with 100% oxygen Intubation Type: Inhalational induction with existing ETT and Combination inhalational/ intravenous induction Tube type: Oral Tube size: 7.5 mm Placement Confirmation: positive ETCO2,  CO2 detector and breath sounds checked- equal and bilateral Secured at: 27 cm Tube secured with: Tape Dental Injury: Teeth and Oropharynx as per pre-operative assessment

## 2011-12-21 NOTE — Anesthesia Preprocedure Evaluation (Signed)
Anesthesia Evaluation  Patient identified by MRN, date of birth, ID band Patient unresponsive    Reviewed: Allergy & Precautions, H&P , NPO status , Patient's Chart, lab work & pertinent test results, Unable to perform ROS - Chart review only  Airway      Comment: Intubated and deep sedation from ER Dental   Pulmonary      + intubated    Cardiovascular regular     Neuro/Psych  C-spine not cleared    GI/Hepatic (+)     substance abuse  alcohol use,   Endo/Other    Renal/GU      Musculoskeletal   Abdominal   Peds  Hematology   Anesthesia Other Findings See surgeon H/P  Reproductive/Obstetrics                           Anesthesia Physical Anesthesia Plan  ASA: III and Emergent  Anesthesia Plan: General   Post-op Pain Management:    Induction: Intravenous  Airway Management Planned: Oral ETT  Additional Equipment:   Intra-op Plan:   Post-operative Plan: Post-operative intubation/ventilation  Informed Consent:   History available from chart only and Only emergency history available  Plan Discussed with: CRNA and Surgeon  Anesthesia Plan Comments:         Anesthesia Quick Evaluation

## 2011-12-21 NOTE — ED Notes (Signed)
Pt in ct. Scan
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
## 2011-12-22 ENCOUNTER — Inpatient Hospital Stay (HOSPITAL_COMMUNITY): Payer: Medicare Other

## 2011-12-22 ENCOUNTER — Encounter (HOSPITAL_COMMUNITY): Payer: Self-pay

## 2011-12-22 DIAGNOSIS — S0285XA Fracture of orbit, unspecified, initial encounter for closed fracture: Secondary | ICD-10-CM | POA: Diagnosis present

## 2011-12-22 DIAGNOSIS — D62 Acute posthemorrhagic anemia: Secondary | ICD-10-CM | POA: Diagnosis not present

## 2011-12-22 DIAGNOSIS — S0240DA Maxillary fracture, left side, initial encounter for closed fracture: Secondary | ICD-10-CM | POA: Diagnosis present

## 2011-12-22 DIAGNOSIS — J95821 Acute postprocedural respiratory failure: Secondary | ICD-10-CM | POA: Diagnosis present

## 2011-12-22 DIAGNOSIS — S022XXA Fracture of nasal bones, initial encounter for closed fracture: Secondary | ICD-10-CM | POA: Diagnosis present

## 2011-12-22 DIAGNOSIS — S02402A Zygomatic fracture, unspecified, initial encounter for closed fracture: Secondary | ICD-10-CM | POA: Diagnosis present

## 2011-12-22 DIAGNOSIS — F10929 Alcohol use, unspecified with intoxication, unspecified: Secondary | ICD-10-CM | POA: Diagnosis present

## 2011-12-22 DIAGNOSIS — S066X9A Traumatic subarachnoid hemorrhage with loss of consciousness of unspecified duration, initial encounter: Secondary | ICD-10-CM | POA: Diagnosis present

## 2011-12-22 LAB — BASIC METABOLIC PANEL
CO2: 26 mEq/L (ref 19–32)
Glucose, Bld: 183 mg/dL — ABNORMAL HIGH (ref 70–99)
Potassium: 3.5 mEq/L (ref 3.5–5.1)
Sodium: 135 mEq/L (ref 135–145)

## 2011-12-22 LAB — GLUCOSE, CAPILLARY: Glucose-Capillary: 157 mg/dL — ABNORMAL HIGH (ref 70–99)

## 2011-12-22 LAB — CBC
Hemoglobin: 10.2 g/dL — ABNORMAL LOW (ref 13.0–17.0)
RBC: 3.06 MIL/uL — ABNORMAL LOW (ref 4.22–5.81)

## 2011-12-22 MED ORDER — PIPERACILLIN-TAZOBACTAM 3.375 G IVPB
3.3750 g | Freq: Three times a day (TID) | INTRAVENOUS | Status: DC
  Administered 2011-12-22 – 2011-12-26 (×12): 3.375 g via INTRAVENOUS
  Filled 2011-12-22 (×13): qty 50

## 2011-12-22 MED ORDER — VITAMIN B-1 100 MG PO TABS
100.0000 mg | ORAL_TABLET | Freq: Every day | ORAL | Status: DC
  Administered 2011-12-23 – 2012-01-07 (×16): 100 mg via ORAL
  Filled 2011-12-22 (×17): qty 1

## 2011-12-22 MED ORDER — PANTOPRAZOLE SODIUM 40 MG IV SOLR
40.0000 mg | Freq: Every day | INTRAVENOUS | Status: DC
  Administered 2011-12-22 – 2011-12-25 (×4): 40 mg via INTRAVENOUS
  Filled 2011-12-22 (×4): qty 40

## 2011-12-22 MED ORDER — CHLORHEXIDINE GLUCONATE 0.12 % MT SOLN
OROMUCOSAL | Status: AC
  Administered 2011-12-22: 15 mL via OROMUCOSAL
  Filled 2011-12-22: qty 15

## 2011-12-22 MED ORDER — ADULT MULTIVITAMIN W/MINERALS CH
1.0000 | ORAL_TABLET | Freq: Every day | ORAL | Status: DC
  Administered 2011-12-22 – 2011-12-27 (×6): 1 via ORAL
  Filled 2011-12-22 (×6): qty 1

## 2011-12-22 MED ORDER — THIAMINE HCL 100 MG/ML IJ SOLN
100.0000 mg | Freq: Every day | INTRAMUSCULAR | Status: DC
  Administered 2011-12-22: 100 mg via INTRAVENOUS
  Filled 2011-12-22 (×16): qty 1

## 2011-12-22 MED ORDER — VITAMIN C 500 MG PO TABS
1000.0000 mg | ORAL_TABLET | Freq: Three times a day (TID) | ORAL | Status: DC
  Administered 2011-12-22 – 2011-12-27 (×14): 1000 mg
  Filled 2011-12-22 (×18): qty 2

## 2011-12-22 MED ORDER — LORAZEPAM 2 MG/ML IJ SOLN: 0.0000 mg | Freq: Two times a day (BID) | INTRAMUSCULAR | Status: AC

## 2011-12-22 MED ORDER — PROPOFOL 10 MG/ML IV EMUL: 5.0000 ug/kg/min | INTRAVENOUS | Status: DC

## 2011-12-22 MED ORDER — SODIUM CHLORIDE 0.9 % IV SOLN
2500.0000 ug | INTRAVENOUS | Status: DC | PRN
  Administered 2011-12-21: 50 ug/h via INTRAVENOUS

## 2011-12-22 MED ORDER — VITAMIN E 100 UNT/0.25ML PO OIL
400.0000 [IU] | TOPICAL_OIL | Freq: Three times a day (TID) | ORAL | Status: DC
  Filled 2011-12-22 (×3): qty 1

## 2011-12-22 MED ORDER — SODIUM CHLORIDE 0.9 % IV SOLN
INTRAVENOUS | Status: DC
  Administered 2011-12-22 – 2011-12-28 (×9): via INTRAVENOUS
  Administered 2011-12-28: 75 mL/h via INTRAVENOUS
  Administered 2011-12-29: 10:00:00 via INTRAVENOUS

## 2011-12-22 MED ORDER — FENTANYL BOLUS VIA INFUSION
50.0000 ug | Freq: Four times a day (QID) | INTRAVENOUS | Status: DC | PRN
  Administered 2011-12-22: 50 ug/kg/h via INTRAVENOUS
  Administered 2012-01-02: 50 ug via INTRAVENOUS
  Filled 2011-12-22: qty 100

## 2011-12-22 MED ORDER — PIVOT 1.5 CAL PO LIQD
1000.0000 mL | ORAL | Status: DC
  Administered 2011-12-22 – 2011-12-23 (×2): 1000 mL
  Filled 2011-12-22 (×4): qty 1000

## 2011-12-22 MED ORDER — VITAMIN E 15 UNIT/0.3ML PO SOLN: 400.0000 [IU] | Freq: Three times a day (TID) | ORAL | Status: DC

## 2011-12-22 MED ORDER — MIDAZOLAM HCL 5 MG/5ML IJ SOLN
INTRAMUSCULAR | Status: DC | PRN
  Administered 2011-12-22: 2 mg via INTRAVENOUS

## 2011-12-22 MED ORDER — CHLORHEXIDINE GLUCONATE 0.12 % MT SOLN
15.0000 mL | Freq: Two times a day (BID) | OROMUCOSAL | Status: DC
  Administered 2011-12-22 – 2012-01-07 (×33): 15 mL via OROMUCOSAL
  Filled 2011-12-22 (×33): qty 15

## 2011-12-22 MED ORDER — FOLIC ACID 1 MG PO TABS
1.0000 mg | ORAL_TABLET | Freq: Every day | ORAL | Status: DC
  Administered 2011-12-22 – 2012-01-07 (×17): 1 mg via ORAL
  Filled 2011-12-22 (×17): qty 1

## 2011-12-22 MED ORDER — BACITRACIN ZINC 500 UNIT/GM EX OINT
TOPICAL_OINTMENT | Freq: Two times a day (BID) | CUTANEOUS | Status: DC
  Administered 2011-12-22 – 2011-12-25 (×8): via TOPICAL
  Administered 2011-12-25: 1 via TOPICAL
  Administered 2011-12-26 – 2012-01-07 (×25): via TOPICAL
  Filled 2011-12-22 (×2): qty 15

## 2011-12-22 MED ORDER — LORAZEPAM 1 MG PO TABS: 1.0000 mg | ORAL_TABLET | Freq: Four times a day (QID) | ORAL | Status: AC | PRN

## 2011-12-22 MED ORDER — VITAMIN E 15 UNIT/0.3ML PO SOLN
400.0000 [IU] | Freq: Three times a day (TID) | ORAL | Status: DC
  Administered 2011-12-22 – 2011-12-27 (×14): 400 [IU]
  Filled 2011-12-22 (×18): qty 8

## 2011-12-22 MED ORDER — ONDANSETRON HCL 4 MG/2ML IJ SOLN: 4.0000 mg | Freq: Four times a day (QID) | INTRAMUSCULAR | Status: DC | PRN

## 2011-12-22 MED ORDER — INSULIN ASPART 100 UNIT/ML ~~LOC~~ SOLN
0.0000 [IU] | Freq: Three times a day (TID) | SUBCUTANEOUS | Status: DC
  Administered 2011-12-22: 2 [IU] via SUBCUTANEOUS
  Administered 2011-12-22: 3 [IU] via SUBCUTANEOUS
  Filled 2011-12-22: qty 3

## 2011-12-22 MED ORDER — SODIUM CHLORIDE 0.9 % IV SOLN
50.0000 ug/h | INTRAVENOUS | Status: DC
  Administered 2011-12-22: 50 ug/h via INTRAVENOUS
  Administered 2011-12-23: 100 ug/h via INTRAVENOUS
  Administered 2011-12-24: 50 ug/h via INTRAVENOUS
  Administered 2011-12-25: 100 ug/h via INTRAVENOUS
  Administered 2011-12-26: 175 ug/h via INTRAVENOUS
  Administered 2011-12-26: 125 ug/h via INTRAVENOUS
  Administered 2011-12-27: 175 ug/h via INTRAVENOUS
  Administered 2011-12-28: 100 ug/h via INTRAVENOUS
  Administered 2011-12-28: 175 ug/h via INTRAVENOUS
  Administered 2011-12-29: 100 ug/h via INTRAVENOUS
  Administered 2011-12-30: 125 ug/h via INTRAVENOUS
  Administered 2011-12-31 (×2): 150 ug/h via INTRAVENOUS
  Administered 2012-01-01: 100 ug/h via INTRAVENOUS
  Filled 2011-12-22 (×15): qty 50

## 2011-12-22 MED ORDER — ACETAMINOPHEN 650 MG RE SUPP
650.0000 mg | Freq: Four times a day (QID) | RECTAL | Status: DC | PRN
  Administered 2011-12-22: 650 mg via RECTAL
  Filled 2011-12-22: qty 1

## 2011-12-22 MED ORDER — LORAZEPAM 2 MG/ML IJ SOLN: 0.0000 mg | Freq: Four times a day (QID) | INTRAMUSCULAR | Status: AC

## 2011-12-22 MED ORDER — ONDANSETRON HCL 4 MG PO TABS: 4.0000 mg | ORAL_TABLET | Freq: Four times a day (QID) | ORAL | Status: DC | PRN

## 2011-12-22 MED ORDER — TOBRAMYCIN 0.3 % OP OINT
TOPICAL_OINTMENT | Freq: Two times a day (BID) | OPHTHALMIC | Status: DC
  Administered 2011-12-22 – 2012-01-06 (×33): via OPHTHALMIC
  Filled 2011-12-22 (×2): qty 3.5

## 2011-12-22 MED ORDER — DEXAMETHASONE SODIUM PHOSPHATE 4 MG/ML IJ SOLN
INTRAMUSCULAR | Status: DC | PRN
  Administered 2011-12-21: 8 mg via INTRAVENOUS

## 2011-12-22 MED ORDER — SELENIUM 50 MCG PO TABS
200.0000 ug | ORAL_TABLET | Freq: Every day | ORAL | Status: DC
  Administered 2011-12-22 – 2011-12-27 (×6): 200 ug
  Filled 2011-12-22 (×6): qty 4

## 2011-12-22 MED ORDER — LORAZEPAM 2 MG/ML IJ SOLN: 1.0000 mg | Freq: Four times a day (QID) | INTRAMUSCULAR | Status: AC | PRN

## 2011-12-22 MED ORDER — BIOTENE DRY MOUTH MT LIQD
15.0000 mL | Freq: Four times a day (QID) | OROMUCOSAL | Status: DC
Start: 2011-12-22 — End: 2012-01-07
  Administered 2011-12-22 – 2012-01-07 (×62): 15 mL via OROMUCOSAL

## 2011-12-22 MED ORDER — SODIUM CHLORIDE 0.9 % IV SOLN
50.0000 mg | INTRAVENOUS | Status: DC | PRN
  Administered 2011-12-21: 1 mg/h via INTRAVENOUS

## 2011-12-22 MED ORDER — PANTOPRAZOLE SODIUM 40 MG PO TBEC: 40.0000 mg | DELAYED_RELEASE_TABLET | Freq: Every day | ORAL | Status: DC
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
## 2011-12-22 NOTE — Transfer of Care (Signed)
Immediate Anesthesia Transfer of Care Note  Patient: Dakota Stewart  Procedure(s) Performed:  REPAIR MULTIPLE LACERATIONS; NASAL RECONSTRUCTION WITH SEPTAL REPAIR; OPEN REDUCTION INTERNAL FIXATION (ORIF) FACIAL FRACTURE  Patient Location: PACU and NICU  Anesthesia Type: General  Level of Consciousness: sedated and Patient remains intubated per anesthesia plan  Airway & Oxygen Therapy: Patient remains intubated per anesthesia plan and Patient placed on Ventilator (see vital sign flow sheet for setting)  Post-op Assessment: Post -op Vital signs reviewed and stable and report given to NICU nurse; Clydie Braun, RN  Post vital signs: Reviewed and stable Filed Vitals:   12/22/11 0140  BP:   Pulse: 89  Temp:   Resp: 16    Complications: No apparent anesthesia complications

## 2011-12-22 NOTE — Anesthesia Postprocedure Evaluation (Signed)
  Anesthesia Post-op Note  Patient: Dakota Stewart  Procedure(s) Performed:  REPAIR MULTIPLE LACERATIONS; NASAL RECONSTRUCTION WITH SEPTAL REPAIR; OPEN REDUCTION INTERNAL FIXATION (ORIF) FACIAL FRACTURE  Patient Location: PACU and ICU  Anesthesia Type: General  Level of Consciousness: unresponsive  Airway and Oxygen Therapy: Patient remains intubated per anesthesia plan  Post-op Pain: none  Post-op Assessment: Post-op Vital signs reviewed, Patient's Cardiovascular Status Stable, Respiratory Function Stable, Patent Airway, No signs of Nausea or vomiting and Pain level controlled  Post-op Vital Signs: Reviewed and stable  Complications: No apparent anesthesia complications
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
## 2011-12-22 NOTE — Anesthesia Postprocedure Evaluation (Signed)
  Anesthesia Post-op Note  Patient: Dakota Stewart  Procedure(s) Performed:  REPAIR MULTIPLE LACERATIONS; NASAL RECONSTRUCTION WITH SEPTAL REPAIR; OPEN REDUCTION INTERNAL FIXATION (ORIF) FACIAL FRACTURE  Patient Location: PACU and NICU  Anesthesia Type: General  Level of Consciousness: sedated and Patient remains intubated per anesthesia plan  Airway and Oxygen Therapy: Patient remains intubated per anesthesia plan and Patient placed on Ventilator (see vital sign flow sheet for setting)  Post-op Pain: none  Post-op Assessment: Post-op Vital signs reviewed, Patient's Cardiovascular Status Stable and No signs of Nausea or vomiting  Post-op Vital Signs: Reviewed and stable  Complications: No apparent anesthesia complications
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
## 2011-12-22 NOTE — Brief Op Note (Signed)
12/21/2011 - 12/22/2011  1:39 AM  PATIENT:  Dakota Stewart  66 y.o. male  PRE-OPERATIVE DIAGNOSIS:  Facial Fractures and Lacerations  POST-OPERATIVE DIAGNOSIS:  Facial Fractures and Lacerations  PROCEDURE:  Procedure(s): REPAIR MULTIPLE LACERATIONS NASAL RECONSTRUCTION WITH SEPTAL REPAIR OPEN REDUCTION INTERNAL FIXATION (ORIF) FACIAL FRACTURE  SURGEON:  Surgeon(s): Barbee Cough  PHYSICIAN ASSISTANT:   ASSISTANTS: none   ANESTHESIA:   general  EBL:100 cc  BLOOD ADMINISTERED:none  DRAINS: none   LOCAL MEDICATIONS USED:  LIDOCAINE 6CC  SPECIMEN:  No Specimen  DISPOSITION OF SPECIMEN:  N/A  COUNTS:  YES  TOURNIQUET:  * No tourniquets in log *  DICTATION: .Other Dictation: Dictation Number [id number]   PLAN OF CARE: Admit to inpatient   PATIENT DISPOSITION:  PACU - hemodynamically stable.   Delay start of Pharmacological VTE agent (>24hrs) due to surgical blood loss or risk of bleeding:  {YES/NO/NOT APPLICABLE:20182
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
## 2011-12-22 NOTE — Progress Notes (Signed)
   ENT Progress Note: POD #1  s/p Procedure(s): REPAIR MULTIPLE LACERATIONS NASAL RECONSTRUCTION WITH SEPTAL REPAIR OPEN REDUCTION INTERNAL FIXATION (ORIF) FACIAL FRACTURE   Subjective: Pt intubated and min responsive  Objective: Vital signs in last 24 hours: Temp:  [98.6 F (37 C)-101.7 F (38.7 C)] 100.4 F (38 C) (01/13 1000) Pulse Rate:  [62-89] 66  (01/13 1000) Resp:  [10-20] 16  (01/13 1000) BP: (101-173)/(64-104) 147/83 mmHg (01/13 1000) SpO2:  [99 %-100 %] 100 % (01/13 1000) FiO2 (%):  [29.6 %-100 %] 30.3 % (01/13 1000) Weight:  [65.1 kg (143 lb 8.3 oz)-86.183 kg (190 lb)] 65.9 kg (145 lb 4.5 oz) (01/13 0800) Weight change:     Intake/Output from previous day: 01/12 0701 - 01/13 0700 In: 2806 [I.V.:2806] Out: 2000 [Urine:1700; Blood:300] Intake/Output this shift: Total I/O In: 12 [I.V.:12] Out: 215 [Urine:215]  CT reviewed, ORIF adequate  PHYSICAL EXAM: Sig periorbital edema, min reactive pupils Incisions intact   Assessment/Plan: Patient stable Cont abx and wound care Will follow    Dakota Stewart L 12/22/2011, 10:42 AM

## 2011-12-23 ENCOUNTER — Inpatient Hospital Stay (HOSPITAL_COMMUNITY): Payer: Medicare Other

## 2011-12-23 ENCOUNTER — Encounter (HOSPITAL_COMMUNITY): Payer: Self-pay | Admitting: Radiology

## 2011-12-23 LAB — CBC
Hemoglobin: 8.6 g/dL — ABNORMAL LOW (ref 13.0–17.0)
MCH: 32.7 pg (ref 26.0–34.0)
MCH: 32.8 pg (ref 26.0–34.0)
MCHC: 34.2 g/dL (ref 30.0–36.0)
MCV: 94.3 fL (ref 78.0–100.0)
MCV: 95.7 fL (ref 78.0–100.0)
Platelets: 128 10*3/uL — ABNORMAL LOW (ref 150–400)
RBC: 2.63 MIL/uL — ABNORMAL LOW (ref 4.22–5.81)
RBC: 3.05 MIL/uL — ABNORMAL LOW (ref 4.22–5.81)
RDW: 14.2 % (ref 11.5–15.5)
WBC: 12.2 10*3/uL — ABNORMAL HIGH (ref 4.0–10.5)

## 2011-12-23 LAB — BASIC METABOLIC PANEL
BUN: 10 mg/dL (ref 6–23)
CO2: 24 mEq/L (ref 19–32)
Calcium: 7.6 mg/dL — ABNORMAL LOW (ref 8.4–10.5)
Creatinine, Ser: 0.74 mg/dL (ref 0.50–1.35)
Glucose, Bld: 160 mg/dL — ABNORMAL HIGH (ref 70–99)

## 2011-12-23 LAB — GLUCOSE, CAPILLARY
Glucose-Capillary: 118 mg/dL — ABNORMAL HIGH (ref 70–99)
Glucose-Capillary: 151 mg/dL — ABNORMAL HIGH (ref 70–99)

## 2011-12-23 LAB — CULTURE, BAL-QUANTITATIVE W GRAM STAIN: Colony Count: >=100000

## 2011-12-23 MED ORDER — POTASSIUM CHLORIDE 10 MEQ/100ML IV SOLN
10.0000 meq | INTRAVENOUS | Status: AC
  Administered 2011-12-23 (×2): 10 meq via INTRAVENOUS
  Filled 2011-12-23 (×2): qty 100

## 2011-12-23 MED ORDER — ALBUTEROL SULFATE (5 MG/ML) 0.5% IN NEBU
2.5000 mg | INHALATION_SOLUTION | Freq: Four times a day (QID) | RESPIRATORY_TRACT | Status: DC
  Administered 2011-12-23 – 2011-12-26 (×12): 2.5 mg via RESPIRATORY_TRACT
  Filled 2011-12-23 (×12): qty 0.5

## 2011-12-23 MED ORDER — PIVOT 1.5 CAL PO LIQD
1000.0000 mL | ORAL | Status: DC
  Administered 2011-12-24 – 2011-12-26 (×3): 1000 mL
  Filled 2011-12-23 (×5): qty 1000

## 2011-12-23 MED ORDER — IPRATROPIUM BROMIDE 0.02 % IN SOLN
0.5000 mg | Freq: Four times a day (QID) | RESPIRATORY_TRACT | Status: DC
  Administered 2011-12-23 – 2011-12-26 (×12): 0.5 mg via RESPIRATORY_TRACT
  Filled 2011-12-23 (×12): qty 2.5

## 2011-12-23 MED ORDER — GUAIFENESIN 100 MG/5ML PO SOLN
30.0000 mL | Freq: Four times a day (QID) | ORAL | Status: DC
  Administered 2011-12-23 – 2011-12-27 (×15): 600 mg
  Filled 2011-12-23 (×20): qty 30

## 2011-12-23 MED ORDER — POTASSIUM CHLORIDE 20 MEQ/15ML (10%) PO LIQD
20.0000 meq | Freq: Three times a day (TID) | ORAL | Status: AC
  Administered 2011-12-23 – 2011-12-24 (×6): 20 meq via ORAL
  Filled 2011-12-23 (×8): qty 15

## 2011-12-23 MED ORDER — INSULIN ASPART 100 UNIT/ML ~~LOC~~ SOLN
0.0000 [IU] | SUBCUTANEOUS | Status: DC
  Administered 2011-12-23 (×2): 1 [IU] via SUBCUTANEOUS
  Administered 2011-12-23: 2 [IU] via SUBCUTANEOUS
  Administered 2011-12-24 – 2011-12-25 (×5): 1 [IU] via SUBCUTANEOUS
  Administered 2011-12-25: 2 [IU] via SUBCUTANEOUS
  Administered 2011-12-25: 1 [IU] via SUBCUTANEOUS
  Administered 2011-12-25 – 2011-12-26 (×4): 2 [IU] via SUBCUTANEOUS
  Administered 2011-12-26 (×2): 1 [IU] via SUBCUTANEOUS
  Administered 2011-12-26: 2 [IU] via SUBCUTANEOUS
  Administered 2011-12-26 – 2011-12-28 (×3): 1 [IU] via SUBCUTANEOUS
  Administered 2011-12-30 (×3): 2 [IU] via SUBCUTANEOUS
  Administered 2011-12-30 – 2012-01-01 (×4): 1 [IU] via SUBCUTANEOUS
  Administered 2012-01-02: 2 [IU] via SUBCUTANEOUS
  Administered 2012-01-02: 3 [IU] via SUBCUTANEOUS
  Administered 2012-01-02 – 2012-01-03 (×8): 2 [IU] via SUBCUTANEOUS
  Administered 2012-01-04: 1 [IU] via SUBCUTANEOUS
  Administered 2012-01-04 (×2): 2 [IU] via SUBCUTANEOUS
  Administered 2012-01-04: 1 [IU] via SUBCUTANEOUS
  Administered 2012-01-04 – 2012-01-05 (×5): 2 [IU] via SUBCUTANEOUS
  Administered 2012-01-05: 3 [IU] via SUBCUTANEOUS
  Administered 2012-01-05: 2 [IU] via SUBCUTANEOUS
  Administered 2012-01-05: 1 [IU] via SUBCUTANEOUS
  Administered 2012-01-06 (×2): 2 [IU] via SUBCUTANEOUS
  Administered 2012-01-06: 3 [IU] via SUBCUTANEOUS
  Administered 2012-01-06 – 2012-01-07 (×4): 1 [IU] via SUBCUTANEOUS
  Administered 2012-01-07: 3 [IU] via SUBCUTANEOUS
  Administered 2012-01-07: 1 [IU] via SUBCUTANEOUS

## 2011-12-23 NOTE — Procedures (Signed)
Mini BAL Procedure Note Meer Reindl [id number] 09/06/1946  Procedure: Mini Bronchial Alveolar Lavage  Procedure Details: In preparation for procedure, Patient hyper-oxygenated with 100 % FiO2 Sterile Technique used:gloves, gown and mask Amount of Saline administered: 40 (ml) Specimen amount collected: 5 (ml)  Evaluation: BP 99/65  Pulse 57  Temp(Src) 99 F (37.2 C) (Core (Comment))  Resp 16  Ht 5\' 8"  (1.727 m)  Wt 147 lb 0.8 oz (66.7 kg)  BMI 22.36 kg/m2  SpO2 100% O2 sats: stable throughout Breath Sounds: Rhonch Patient's Current Condition: stable Complications: No apparent complications Patient did tolerate procedure well.   Loyal Jacobson [facility] 12/23/2011, 12:36 PM

## 2011-12-23 NOTE — Progress Notes (Signed)
   ENT Progress Note: POD #2  s/p Procedure(s): REPAIR MULTIPLE LACERATIONS NASAL RECONSTRUCTION WITH SEPTAL REPAIR OPEN REDUCTION INTERNAL FIXATION (ORIF) FACIAL FRACTURE   Subjective: Pt intubated and unresponsive  Objective: Vital signs in last 24 hours: Temp:  [98.2 F (36.8 C)-100.4 F (38 C)] 98.4 F (36.9 C) (01/14 0700) Pulse Rate:  [41-87] 56  (01/14 0816) Resp:  [0-18] 16  (01/14 0816) BP: (97-163)/(62-90) 100/67 mmHg (01/14 0816) SpO2:  [98 %-100 %] 100 % (01/14 0824) FiO2 (%):  [29.7 %-30.3 %] 30 % (01/14 0824) Weight:  [66.7 kg (147 lb 0.8 oz)] 66.7 kg (147 lb 0.8 oz) (01/14 0440) Weight change: -6.676 kg (-14 lb 11.5 oz)    Intake/Output from previous day: 01/13 0701 - 01/14 0700 In: 657 [I.V.:77; NG/GT:530; IV Piggyback:50] Out: 1525 [Urine:1475; Emesis/NG output:50] Intake/Output this shift: Total I/O In: 170 [I.V.:50; NG/GT:120] Out: 150 [Urine:150]   PHYSICAL EXAM: Hvy facial and periorbital edema Inc intact - stable   Assessment/Plan: Stable after facial reconstruction Cont. Current care, monitor neuro status    Dakota Stewart L 12/23/2011, 9:31 AM

## 2011-12-23 NOTE — Progress Notes (Signed)
The patient is not following commands yet but will spontaneously localize and withdraw.  Oxygen saturations are great on FIO2 .30, but has lots of secretions.  With the state of his facial fractures, and facial swelling, it may be better to consider early tracheostomy.  Will look at later this week for possibility of tracheostomy and gastrostomy tube.  This patient has been seen and I agree with the findings and treatment plan.  Marta Lamas. Gae Bon, MD, FACS [phone] (pager) [phone] (direct pager) Trauma Surgeon
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
## 2011-12-23 NOTE — Progress Notes (Signed)
Patient ID: Dakota Stewart, male   DOB: July 15, 1946, 66 y.o.   MRN: [id number] Sedated, intubated. RN told me [name] move all 4 extremities at the end of sedation
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
## 2011-12-24 ENCOUNTER — Encounter (HOSPITAL_COMMUNITY): Payer: Self-pay | Admitting: Neurology

## 2011-12-24 ENCOUNTER — Inpatient Hospital Stay (HOSPITAL_COMMUNITY): Payer: Medicare Other

## 2011-12-24 LAB — COMPREHENSIVE METABOLIC PANEL
ALT: 35 U/L (ref 0–53)
BUN: 8 mg/dL (ref 6–23)
CO2: 24 mEq/L (ref 19–32)
Calcium: 8 mg/dL — ABNORMAL LOW (ref 8.4–10.5)
Creatinine, Ser: 0.6 mg/dL (ref 0.50–1.35)
GFR calc Af Amer: >90 mL/min (ref >90)
GFR calc non Af Amer: >90 mL/min (ref >90)
Glucose, Bld: 138 mg/dL — ABNORMAL HIGH (ref 70–99)
Sodium: 137 mEq/L (ref 135–145)
Total Protein: 6 g/dL (ref 6.0–8.3)

## 2011-12-24 LAB — CBC
HCT: 27.9 % — ABNORMAL LOW (ref 39.0–52.0)
Hemoglobin: 9.5 g/dL — ABNORMAL LOW (ref 13.0–17.0)
MCV: 96.2 fL (ref 78.0–100.0)
RBC: 2.9 MIL/uL — ABNORMAL LOW (ref 4.22–5.81)
RDW: 14 % (ref 11.5–15.5)
WBC: 9 10*3/uL (ref 4.0–10.5)

## 2011-12-24 LAB — GLUCOSE, CAPILLARY
Glucose-Capillary: 130 mg/dL — ABNORMAL HIGH (ref 70–99)
Glucose-Capillary: 126 mg/dL — ABNORMAL HIGH (ref 70–99)
Glucose-Capillary: 131 mg/dL — ABNORMAL HIGH (ref 70–99)

## 2011-12-24 LAB — HEMOGLOBIN A1C: Hgb A1c MFr Bld: 5.9 % — ABNORMAL HIGH (ref <5.7)

## 2011-12-24 MED ORDER — SODIUM CHLORIDE 0.9 % IJ SOLN: 10.0000 mL | INTRAMUSCULAR | Status: DC | PRN

## 2011-12-24 MED ORDER — SODIUM CHLORIDE 0.9 % IJ SOLN
10.0000 mL | Freq: Two times a day (BID) | INTRAMUSCULAR | Status: DC
  Administered 2011-12-25 – 2012-01-06 (×22): 10 mL
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
## 2011-12-24 NOTE — Progress Notes (Signed)
UR completed on chart.   Await improvement in status to determine d/c needs. [name] seems likely. Vent SNF vs SNF. Pt with Medicaid coverage.

## 2011-12-25 ENCOUNTER — Encounter (HOSPITAL_COMMUNITY): Payer: Self-pay | Admitting: General Surgery

## 2011-12-25 LAB — GLUCOSE, CAPILLARY
Glucose-Capillary: 154 mg/dL — ABNORMAL HIGH (ref 70–99)
Glucose-Capillary: 153 mg/dL — ABNORMAL HIGH (ref 70–99)

## 2011-12-25 LAB — TRIGLYCERIDES: Triglycerides: 46 mg/dL (ref <150)

## 2011-12-25 LAB — CBC
MCH: 32.9 pg (ref 26.0–34.0)
MCHC: 35 g/dL (ref 30.0–36.0)
Platelets: 141 10*3/uL — ABNORMAL LOW (ref 150–400)
RBC: 2.8 MIL/uL — ABNORMAL LOW (ref 4.22–5.81)

## 2011-12-25 LAB — BASIC METABOLIC PANEL
BUN: 9 mg/dL (ref 6–23)
Calcium: 8.5 mg/dL (ref 8.4–10.5)
GFR calc Af Amer: >90 mL/min (ref >90)
GFR calc non Af Amer: >90 mL/min (ref >90)
Glucose, Bld: 134 mg/dL — ABNORMAL HIGH (ref 70–99)
Sodium: 135 mEq/L (ref 135–145)

## 2011-12-25 NOTE — Progress Notes (Signed)
Patient ID: Dakota Stewart, male   DOB: [birth year], 66 y.o.   MRN: [id number] Sedated,intubated continue as peer trauma. We will do a ct head on friday

## 2011-12-25 NOTE — Progress Notes (Signed)
Follow up - Trauma and Critical Care  Patient Details:    Dakota Stewart is an 66 y.o. male.  Lines/tubes : Airway 7.5 mm (Active)  Secured at (cm) 26 cm 12/25/2011  3:55 AM  Measured From Lips 12/25/2011  3:55 AM  Secured Location Right 12/25/2011  3:55 AM  Secured By [employer] 12/25/2011  3:55 AM  Tube Holder Repositioned Yes 12/25/2011  3:55 AM  Cuff Pressure (cm H2O) 24 cm H2O 12/25/2011 12:10 AM  Site Condition Edema 12/25/2011  3:55 AM     PICC Triple Lumen 12/24/11 PICC Right Basilic (Active)  Site Assessment Clean;Dry;Intact 12/24/2011  8:00 PM  Lumen #1 Status Infusing 12/24/2011  8:00 PM  Lumen #2 Status Saline locked 12/24/2011  8:00 PM  Lumen #3 Status Saline locked 12/24/2011  8:00 PM  Dressing Type Transparent;Occlusive 12/24/2011  8:00 PM  Dressing Status Clean;Dry;Intact 12/24/2011  8:00 PM     NG/OG Tube Orogastric 16 Fr. Center mouth (Active)  Placement Verification Auscultation 12/24/2011  8:00 PM  Site Assessment Clean;Dry;Intact 12/24/2011  8:00 PM  Status Infusing tube feed 12/24/2011  8:00 PM  Drainage Appearance Bloody;Clots 12/22/2011  5:00 PM  Gastric Residual 325 mL 12/25/2011  5:30 AM  Intake (mL) 50 mL 12/25/2011  5:00 AM  Output (mL) 50 mL 12/22/2011 11:00 AM     Urethral Catheter Temperature probe 16 Fr. (Active)  Site Assessment Clean;Intact;Dry 12/24/2011  8:00 PM  Collection Container Standard drainage bag 12/24/2011  8:00 PM  Securement Method Leg strap 12/24/2011  8:00 PM  Urinary Catheter Interventions Unclamped 12/24/2011  8:00 PM  Indication for Insertion or Continuance of Catheter Urinary output monitoring 12/24/2011  8:00 PM    Microbiology/Sepsis markers: Results for orders placed during the hospital encounter of 12/21/11  CULTURE, BAL-QUANTITATIVE     Status: Normal (Preliminary result)   Collection Time   12/23/11 12:27 PM      Component Value Range Status Comment   Specimen Description BRONCHIAL ALVEOLAR LAVAGE   Final    Special Requests NONE   Final    Gram Stain     Final    Value: FEW WBC PRESENT, PREDOMINANTLY PMN     NO SQUAMOUS EPITHELIAL CELLS SEEN     NO ORGANISMS SEEN   Colony Count PENDING   Incomplete    Culture Culture reincubated for better growth   Final    Report Status PENDING   Incomplete     Anti-infectives:  Anti-infectives     Start     Dose/Rate Route Frequency Ordered Stop   12/22/11 1230  piperacillin-tazobactam (ZOSYN) IVPB 3.375 g       3.375 g 12.5 mL/hr over 240 Minutes Intravenous Every 8 hours 12/22/11 1155            Best Practice/Protocols:  VTE Prophylaxis: Mechanical GI Prophylaxis: Proton Pump Inhibitor Continous Sedation No wake up assessment today yet.  Consults: Treatment Team:  Karn Cassis    Events:  Subjective:    Overnight Issues: No new issues overnight  Objective:  Vital signs for last 24 hours: Temp:  [99 F (37.2 C)-100 F (37.8 C)] 99.1 F (37.3 C) (01/16 0500) Pulse Rate:  [61-92] 92  (01/16 0500) Resp:  [8-18] 17  (01/16 0500) BP: (106-148)/(65-87) 147/86 mmHg (01/16 0500) SpO2:  [94 %-100 %] 100 % (01/16 0500) FiO2 (%):  [29.8 %-30.4 %] 29.9 % (01/16 0500) Weight:  [68.8 kg (151 lb 10.8 oz)] 68.8 kg (151 lb 10.8 oz) (01/16  0500)  Hemodynamic parameters for last 24 hours:    Intake/Output from previous day: 01/15 0701 - 01/16 0700 In: 2567 [I.V.:1317; NG/GT:1100; IV Piggyback:150] Out: 1920 [Urine:1920]  Intake/Output this shift: Total I/O In: 1220 [I.V.:620; NG/GT:500; IV Piggyback:100] Out: 905 [Urine:905]  Vent settings for last 24 hours: Vent Mode:  [-] PRVC FiO2 (%):  [29.8 %-30.4 %] 29.9 % Set Rate:  [16 bmp] 16 bmp Vt Set:  [550 mL] 550 mL PEEP:  [5 cmH20] 5 cmH20 Pressure Support:  [10 cmH20] 10 cmH20 Plateau Pressure:  [14 cmH20-26 cmH20] 26 cmH20  Physical Exam:  General: Not doing much at all\ HEENT/Neck: ETT WNL  and Massive facial swelling, not likely to extubated anytime soon. Resp: rhonchi  bibasilar GI: soft, nontender, BS WNL, no r/g and tolerating tube feedings.  Results for orders placed during the hospital encounter of 12/21/11 (from the past 24 hour(s))  GLUCOSE, CAPILLARY     Status: Abnormal   Collection Time   12/24/11  8:14 AM      Component Value Range   Glucose-Capillary 136 (*) 70 - 99 (mg/dL)  CBC     Status: Abnormal   Collection Time   12/24/11  9:30 AM      Component Value Range   WBC 9.0  4.0 - 10.5 (K/uL)   RBC 2.90 (*) 4.22 - 5.81 (MIL/uL)   Hemoglobin 9.5 (*) 13.0 - 17.0 (g/dL)   HCT 16.1 (*) 09.6 - 52.0 (%)   MCV 96.2  78.0 - 100.0 (fL)   MCH 32.8  26.0 - 34.0 (pg)   MCHC 34.1  30.0 - 36.0 (g/dL)   RDW 04.5  40.9 - 81.1 (%)   Platelets 120 (*) 150 - 400 (K/uL)  COMPREHENSIVE METABOLIC PANEL     Status: Abnormal   Collection Time   12/24/11  9:30 AM      Component Value Range   Sodium 137  135 - 145 (mEq/L)   Potassium 3.9  3.5 - 5.1 (mEq/L)   Chloride 104  96 - 112 (mEq/L)   CO2 24  19 - 32 (mEq/L)   Glucose, Bld 138 (*) 70 - 99 (mg/dL)   BUN 8  6 - 23 (mg/dL)   Creatinine, Ser 9.14  0.50 - 1.35 (mg/dL)   Calcium 8.0 (*) 8.4 - 10.5 (mg/dL)   Total Protein 6.0  6.0 - 8.3 (g/dL)   Albumin 2.2 (*) 3.5 - 5.2 (g/dL)   AST 70 (*) 0 - 37 (U/L)   ALT 35  0 - 53 (U/L)   Alkaline Phosphatase 103  39 - 117 (U/L)   Total Bilirubin 0.8  0.3 - 1.2 (mg/dL)   GFR calc non Af Amer >90  >90 (mL/min)   GFR calc Af Amer >90  >90 (mL/min)  HEMOGLOBIN A1C     Status: Abnormal   Collection Time   12/24/11  9:30 AM      Component Value Range   Hemoglobin A1C 5.9 (*) <5.7 (%)   Mean Plasma Glucose 123 (*) <117 (mg/dL)  GLUCOSE, CAPILLARY     Status: Abnormal   Collection Time   12/24/11 11:49 AM      Component Value Range   Glucose-Capillary 130 (*) 70 - 99 (mg/dL)  GLUCOSE, CAPILLARY     Status: Abnormal   Collection Time   12/24/11  3:46 PM      Component Value Range   Glucose-Capillary 126 (*) 70 - 99 (mg/dL)   Comment 1 Notify RN  Comment 2  Documented in Chart    GLUCOSE, CAPILLARY     Status: Abnormal   Collection Time   12/24/11  8:00 PM      Component Value Range   Glucose-Capillary 131 (*) 70 - 99 (mg/dL)   Comment 1 Notify RN     Comment 2 Documented in Chart    GLUCOSE, CAPILLARY     Status: Abnormal   Collection Time   12/24/11 11:38 PM      Component Value Range   Glucose-Capillary 138 (*) 70 - 99 (mg/dL)   Comment 1 Notify RN     Comment 2 Documented in Chart    GLUCOSE, CAPILLARY     Status: Abnormal   Collection Time   12/25/11  3:42 AM      Component Value Range   Glucose-Capillary 146 (*) 70 - 99 (mg/dL)   Comment 1 Notify RN     Comment 2 Documented in Chart    BASIC METABOLIC PANEL     Status: Abnormal   Collection Time   12/25/11  4:50 AM      Component Value Range   Sodium 135  135 - 145 (mEq/L)   Potassium 3.7  3.5 - 5.1 (mEq/L)   Chloride 104  96 - 112 (mEq/L)   CO2 27  19 - 32 (mEq/L)   Glucose, Bld 134 (*) 70 - 99 (mg/dL)   BUN 9  6 - 23 (mg/dL)   Creatinine, Ser 4.01  0.50 - 1.35 (mg/dL)   Calcium 8.5  8.4 - 02.7 (mg/dL)   GFR calc non Af Amer >90  >90 (mL/min)   GFR calc Af Amer >90  >90 (mL/min)  CBC     Status: Abnormal   Collection Time   12/25/11  4:50 AM      Component Value Range   WBC 10.9 (*) 4.0 - 10.5 (K/uL)   RBC 2.80 (*) 4.22 - 5.81 (MIL/uL)   Hemoglobin 9.2 (*) 13.0 - 17.0 (g/dL)   HCT 25.3 (*) 66.4 - 52.0 (%)   MCV 93.9  78.0 - 100.0 (fL)   MCH 32.9  26.0 - 34.0 (pg)   MCHC 35.0  30.0 - 36.0 (g/dL)   RDW 40.3  47.4 - 25.9 (%)   Platelets 141 (*) 150 - 400 (K/uL)  TRIGLYCERIDES     Status: Normal   Collection Time   12/25/11  5:00 AM      Component Value Range   Triglycerides 46  <150 (mg/dL)     Assessment/Plan:   NEURO  Altered Mental Status:  sedation and 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   Plan: Continue antibiotics  CARDIO  No specific cardiac problems   Plan: CPM  RENAL  urine output good   Plan:  CPM  GI  WNL, soft and not apparently tender Soft   Plan: CPM, continue tube feedings.  ID  Being treated empirically currently.  BAT is not positive yet.   Plan: CPM  HEME  Anemia acute blood loss anemia and anemia of critical illness) Leukocytosis (neutrophilia)   Plan: CPM  ENDO No changes   Plan: CM  Global Issues      LOS: 4 days   Additional comments:I reviewed the patient's new clinical lab test results. CBC/Bmet  Critical Care Total Time*: 30 Minutes  Zelta Enfield III,Rana Adorno O 12/25/2011  *Care during the described time interval was provided by me and/or other providers on  the critical care team.  I have reviewed this patient's available data, including medical history, events of note, physical examination and test results as part of my evaluation.

## 2011-12-26 ENCOUNTER — Inpatient Hospital Stay (HOSPITAL_COMMUNITY): Payer: Medicare Other

## 2011-12-26 LAB — CBC
Hemoglobin: 8.9 g/dL — ABNORMAL LOW (ref 13.0–17.0)
MCH: 33.2 pg (ref 26.0–34.0)
MCHC: 35.5 g/dL (ref 30.0–36.0)
MCV: 93.7 fL (ref 78.0–100.0)
Platelets: 181 10*3/uL (ref 150–400)
RBC: 2.68 MIL/uL — ABNORMAL LOW (ref 4.22–5.81)

## 2011-12-26 LAB — GLUCOSE, CAPILLARY
Glucose-Capillary: 125 mg/dL — ABNORMAL HIGH (ref 70–99)
Glucose-Capillary: 123 mg/dL — ABNORMAL HIGH (ref 70–99)
Glucose-Capillary: 163 mg/dL — ABNORMAL HIGH (ref 70–99)
Glucose-Capillary: 149 mg/dL — ABNORMAL HIGH (ref 70–99)

## 2011-12-26 LAB — BASIC METABOLIC PANEL
BUN: 10 mg/dL (ref 6–23)
Calcium: 8.6 mg/dL (ref 8.4–10.5)
GFR calc Af Amer: >90 mL/min (ref >90)
GFR calc non Af Amer: >90 mL/min (ref >90)
Glucose, Bld: 147 mg/dL — ABNORMAL HIGH (ref 70–99)

## 2011-12-26 LAB — DIFFERENTIAL
Basophils Relative: 0 % (ref 0–1)
Eosinophils Absolute: 0.1 10*3/uL (ref 0.0–0.7)
Eosinophils Relative: 1 % (ref 0–5)
Lymphs Abs: 1.5 10*3/uL (ref 0.7–4.0)
Monocytes Relative: 9 % (ref 3–12)

## 2011-12-26 MED ORDER — PANTOPRAZOLE SODIUM 40 MG PO PACK
40.0000 mg | PACK | Freq: Every day | ORAL | Status: DC
  Administered 2011-12-27 – 2012-01-07 (×12): 40 mg
  Filled 2011-12-26 (×13): qty 20

## 2011-12-26 MED ORDER — METOCLOPRAMIDE HCL 5 MG/ML IJ SOLN
10.0000 mg | Freq: Four times a day (QID) | INTRAMUSCULAR | Status: DC
  Administered 2011-12-26 – 2012-01-01 (×21): 10 mg via INTRAVENOUS
  Filled 2011-12-26 (×27): qty 2

## 2011-12-26 MED ORDER — MOXIFLOXACIN HCL IN NACL 400 MG/250ML IV SOLN
400.0000 mg | INTRAVENOUS | Status: DC
  Administered 2011-12-26 – 2012-01-02 (×8): 400 mg via INTRAVENOUS
  Filled 2011-12-26 (×9): qty 250

## 2011-12-26 MED ORDER — IPRATROPIUM-ALBUTEROL 18-103 MCG/ACT IN AERO
6.0000 | INHALATION_SPRAY | RESPIRATORY_TRACT | Status: DC
  Administered 2011-12-26 – 2012-01-04 (×54): 6 via RESPIRATORY_TRACT
  Filled 2011-12-26 (×3): qty 14.7

## 2011-12-26 MED ORDER — SODIUM CHLORIDE 0.9 % IJ SOLN
INTRAMUSCULAR | Status: AC
  Filled 2011-12-26: qty 10

## 2011-12-26 NOTE — Progress Notes (Signed)
Clinical Social Worker completed the psychosocial assessment which can be found in the shadow chart.  Clinical Social Worker spoke with patient son over the phone to offer emotional support and discuss possible discharge plans. Patient son states that family is managing well at home during this difficult time.    Patient son states patient was living alone prior to admission.  Clinical Social Worker explained several options with patient son regarding discharge plans - SNF vs. [facility].  Patient son understanding and agreeable with patient placement - will await improvement in status to determine needs.    No SBIRT done at this time due to patient currently sedated and intubated.  If patient mental status clears prior to discharge, CSW will complete assessement.  Clinical Social Worker to facilitate patient discharge needs once medically ready.  CSW available for support as needed.  [address] [place], [place] [phone]

## 2011-12-26 NOTE — Progress Notes (Addendum)
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   Calcium 8.6  8.4 - 64.4 (mg/dL)   GFR calc non Af Amer >90  >90 (mL/min)   GFR calc Af Amer >90  >90 (mL/min)    Assessment & Plan: Present on Admission:  .TBI w/SAH .VDRF .Nasal fracture .Orbit fracture, bilateral .Closed bilateral maxillary fractures .Left zygoma fracture .Alcohol intoxication   LOS: 5 days   Additional comments:I reviewed the patient's new clinical lab test results.  [facility] VDRF -- Weaning but may need trach depending on facial edema and neuro status TBI w/SAH/SDH/ICC -- stable exam Multiple facial fxs/lacs s/p ORIF midface, nasal reconstruction w/septal repair, and lac repair -- per    Dr. Annalee Genta. May need further ORIF of orbital fractures at later date. ID --Empiric Zosyn, BAL >100k Moraxella - will change to Avelox ABL anemia  EtOH intoxication -- chronic "moonshine" use per pt family. Continue [place] protocol FEN -- Tolerating TF Hyponatremia -- decrease IVF VTE -- SCD's Dispo -- continue support, may need trach and PEG Critical Care Total Time*: 30 Minutes  Violeta Gelinas, MD, MPH, FACS Pager: [phone]  12/26/2011  *Care during the described time interval was provided by me and/or  other providers on the critical care team.  I have reviewed this patient's available data, including medical history, events of note, physical examination and test results as  part of my evaluation.

## 2011-12-26 NOTE — Progress Notes (Signed)
MD notified about TF residuals >500. TF off for 5 hrs and no digestive progress noted. Ordered to keep TF off overnight and start Reglan IV. Will reassess TF in am.  Minus Liberty RN

## 2011-12-26 NOTE — Progress Notes (Signed)
Patient ID: Dakota Stewart, male   DOB: Apr 13, 1946, 66 y.o.   MRN: [id number] Sedated. Continue as per trauma and ent. Ct head pending.

## 2011-12-27 ENCOUNTER — Inpatient Hospital Stay (HOSPITAL_COMMUNITY): Payer: Medicare Other

## 2011-12-27 LAB — CBC
Platelets: 223 10*3/uL (ref 150–400)
RDW: 13.1 % (ref 11.5–15.5)
WBC: 11.3 10*3/uL — ABNORMAL HIGH (ref 4.0–10.5)

## 2011-12-27 LAB — BASIC METABOLIC PANEL
Chloride: 97 mEq/L (ref 96–112)
Creatinine, Ser: 0.53 mg/dL (ref 0.50–1.35)
GFR calc Af Amer: >90 mL/min (ref >90)
Potassium: 3.8 mEq/L (ref 3.5–5.1)
Sodium: 131 mEq/L — ABNORMAL LOW (ref 135–145)

## 2011-12-27 LAB — GLUCOSE, CAPILLARY: Glucose-Capillary: 103 mg/dL — ABNORMAL HIGH (ref 70–99)

## 2011-12-27 MED ORDER — CLONAZEPAM 0.5 MG PO TABS
0.5000 mg | ORAL_TABLET | Freq: Three times a day (TID) | ORAL | Status: DC
  Administered 2011-12-27 – 2012-01-01 (×14): 0.5 mg via ORAL
  Filled 2011-12-27 (×15): qty 1

## 2011-12-27 NOTE — Progress Notes (Signed)
Patient ID: Dakota Stewart, male   DOB: 1946/11/22, 66 y.o.   MRN: [id number] Sedated. Continue with ventilator. We will repeat ct head once he is off the ventilator

## 2011-12-27 NOTE — Progress Notes (Signed)
UR of chart completed.  

## 2011-12-27 NOTE — Progress Notes (Signed)
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   Chloride 97  96 - 112 (mEq/L)   CO2 28  19 - 32 (mEq/L)   Glucose, Bld 128 (*) 70 - 99 (mg/dL)   BUN 10  6 - 23 (mg/dL)   Creatinine, Ser 9.14  0.50 - 1.35 (mg/dL)   Calcium 8.7  8.4 - 78.2 (mg/dL)   GFR calc non Af Amer >90  >90 (mL/min)   GFR calc Af Amer >90  >90 (mL/min)    Assessment & Plan: Present on Admission:  .TBI w/SAH .VDRF .Nasal fracture .Orbit fracture, bilateral .Closed bilateral maxillary fractures .Left zygoma fracture .Alcohol intoxication   [facility] VDRF -- Weaning but may need trach depending on facial edema and neuro status TBI w/SAH/SDH/ICC -- stable exam Multiple facial fxs/lacs s/p ORIF midface, nasal reconstruction w/septal repair, and lac repair -- per    Dr. Annalee Genta. May need further ORIF of orbital fractures at later date.    Blood per nares this am ID --Empiric Zosyn, BAL >100k Moraxella - will change to Avelox and continue for 5 days total ABL anemia - stable EtOH intoxication -- chronic "moonshine" use per pt family. Continue Ciwa protocol FEN --Reglan begun, check ABD xrays, may need to advance OG tube Hyponatremia -- increase IVF while tube feeds on hold VTE -- SCD's Dispo --  continue support, may need trach and PEG next week Critical Care Total Time*: 30 Minutes  Olukemi Panchal,PA-C Pager [phone] General Trauma Pager [phone]  12/27/2011  *Care  during the described time interval was provided by me and/or other providers on the critical care team.  I have reviewed this patient's available data, including medical history, events of note, physical examination and test results as part of my evaluation.

## 2011-12-27 NOTE — Progress Notes (Signed)
Patient ID: Ciaran Begay, male   DOB: Apr 08, 1946, 66 y.o.   MRN: [id number]  ADDEN:   ABD films showing ileus vs PSBO- Will hold tube feeds and place OG tube on LCS and re check  ABD X-rays again in am.  Also hold majority of enteral medications for now.   Odaliz Mcqueary,PA-C Pager [phone] General Trauma Pager [phone]

## 2011-12-27 NOTE — Progress Notes (Signed)
This patient has been seen and I agree with the findings and treatment plan.  Nevaen Tredway O. Randalyn Ahmed, III, MD, FACS [phone] (pager) [phone] (direct pager) Trauma Surgeon  

## 2011-12-27 NOTE — Plan of Care (Signed)
Problem: Inadequate Intake (NI-2.1) Goal: Food and/or nutrient delivery Individualized approach for food/nutrient provision.  Outcome: Not Progressing TF currently being held, will monitor.

## 2011-12-27 NOTE — Progress Notes (Signed)
The patient is agitated during the wake-up assessment, but not following commands.  Will use the weekend to decide if the patient will need long-term support through tracheostomy and gastrostomy tube placement.  This patient has been seen and I agree with the findings and treatment plan.  Marta Lamas. Gae Bon, MD, FACS [phone] (pager) [phone] (direct pager) Trauma Surgeon

## 2011-12-27 NOTE — Progress Notes (Signed)
Nutrition Follow-up  Diet Order:  NPO  Meds: Scheduled Meds:   . albuterol-ipratropium  6 puff Inhalation Q4H  . antiseptic oral rinse  15 mL Mouth Rinse QID  . bacitracin   Topical BID  . chlorhexidine  15 mL Mouth Rinse BID  . clonazePAM  0.5 mg Oral Q8H  . folic acid  1 mg Oral Daily  . insulin aspart  0-9 Units Subcutaneous Q4H  . LORazepam  0-4 mg Intravenous Q12H  . metoCLOPramide (REGLAN) injection  10 mg Intravenous Q6H  . moxifloxacin  400 mg Intravenous Q24H  . pantoprazole sodium  40 mg Per Tube Daily  . sodium chloride  10 mL Intracatheter Q12H  . thiamine  100 mg Oral Daily   Or  . thiamine  100 mg Intravenous Daily  . tobramycin   Both Eyes BID  . DISCONTD: feeding supplement (PIVOT 1.5 CAL)  1,000 mL Per Tube Q24H  . DISCONTD: guaiFENesin  30 mL Per Tube Q6H  . DISCONTD: mulitivitamin with minerals  1 tablet Oral Daily  . DISCONTD: selenium  200 mcg Per Tube Daily  . DISCONTD: vitamin C  1,000 mg Per Tube Q8H  . DISCONTD: vitamin e  400 Units Per Tube Q8H   Continuous Infusions:   . sodium chloride 75 mL/hr at 12/27/11 1500  . fentaNYL infusion INTRAVENOUS 175 mcg/hr (12/27/11 1400)  . midazolam (VERSED) infusion 2 mg/hr (12/27/11 1400)   PRN Meds:.acetaminophen, fentaNYL, ondansetron (ZOFRAN) IV, ondansetron, sodium chloride  Labs:  CMP     Component Value Date/Time   NA 131* 12/27/2011 0350   K 3.8 12/27/2011 0350   CL 97 12/27/2011 0350   CO2 28 12/27/2011 0350   GLUCOSE 128* 12/27/2011 0350   BUN 10 12/27/2011 0350   CREATININE 0.53 12/27/2011 0350   CALCIUM 8.7 12/27/2011 0350   PROT 6.0 12/24/2011 0930   ALBUMIN 2.2* 12/24/2011 0930   AST 70* 12/24/2011 0930   ALT 35 12/24/2011 0930   ALKPHOS 103 12/24/2011 0930   BILITOT 0.8 12/24/2011 0930   GFRNONAA >90 12/27/2011 0350   GFRAA >90 12/27/2011 0350     Intake/Output Summary (Last 24 hours) at 12/27/11 1555 Last data filed at 12/27/11 1400  Gross per 24 hour  Intake   1110 ml  Output   1390 ml  Net    -280 ml   Noted TF currently held due to high residuals of >500 1/17. Pt on Reglan. Per ABD xray pt with ileus vs PSBO. OG tube now to LCS. Will continue to monitor.  Weight Status:  155 lbs, wt trending up  Re-estimated needs:   1624 kcals 90-115 grams protein >2L/day fluid  Nutrition Dx:  Inadequate oral intake (NI-2.1). Status: Ongoing  Goal:  Meet >90% estimated needs  Intervention:    Once ready to resume TF recommend Pivot 1.5 @ 53ml/hr increase by 10 ml every 4 hours to goal of 45 ml/hr, which will provide 1620 kcals, 101 grams protein, 821 ml H2O  Monitor:  TF tolerance   Dakota Stewart Pager #:  [phone]

## 2011-12-28 ENCOUNTER — Inpatient Hospital Stay (HOSPITAL_COMMUNITY): Payer: Medicare Other

## 2011-12-28 LAB — BASIC METABOLIC PANEL
BUN: 12 mg/dL (ref 6–23)
Calcium: 8.4 mg/dL (ref 8.4–10.5)
Creatinine, Ser: 0.53 mg/dL (ref 0.50–1.35)
GFR calc Af Amer: >90 mL/min (ref >90)
GFR calc non Af Amer: >90 mL/min (ref >90)

## 2011-12-28 LAB — CBC
HCT: 25.9 % — ABNORMAL LOW (ref 39.0–52.0)
MCHC: 34.4 g/dL (ref 30.0–36.0)
MCV: 94.9 fL (ref 78.0–100.0)
RDW: 13.1 % (ref 11.5–15.5)

## 2011-12-28 LAB — GLUCOSE, CAPILLARY
Glucose-Capillary: 102 mg/dL — ABNORMAL HIGH (ref 70–99)
Glucose-Capillary: 109 mg/dL — ABNORMAL HIGH (ref 70–99)
Glucose-Capillary: 111 mg/dL — ABNORMAL HIGH (ref 70–99)
Glucose-Capillary: 112 mg/dL — ABNORMAL HIGH (ref 70–99)
Glucose-Capillary: 119 mg/dL — ABNORMAL HIGH (ref 70–99)
Glucose-Capillary: 134 mg/dL — ABNORMAL HIGH (ref 70–99)

## 2011-12-28 MED ORDER — BISACODYL 10 MG RE SUPP
10.0000 mg | Freq: Once | RECTAL | Status: DC
  Filled 2011-12-28 (×2): qty 1

## 2011-12-28 MED ORDER — ARTIFICIAL TEARS OP OINT
1.0000 "application " | TOPICAL_OINTMENT | Freq: Three times a day (TID) | OPHTHALMIC | Status: DC
  Administered 2011-12-28 – 2012-01-07 (×28): 1 via OPHTHALMIC
  Filled 2011-12-28: qty 3.5

## 2011-12-28 MED ORDER — PIVOT 1.5 CAL PO LIQD
1000.0000 mL | ORAL | Status: DC
  Administered 2011-12-28: 1000 mL
  Filled 2011-12-28 (×2): qty 1000

## 2011-12-28 MED ORDER — BISACODYL 10 MG RE SUPP
10.0000 mg | Freq: Every day | RECTAL | Status: DC | PRN
  Administered 2011-12-28: 10 mg via RECTAL

## 2011-12-28 NOTE — Progress Notes (Signed)
Subjective: Patient sedated,intubated  Objective: Vital signs in last 24 hours: Temp:  [98.4 F (36.9 C)-100.4 F (38 C)] 100 F (37.8 C) (01/19 0700) Pulse Rate:  [64-99] 73  (01/19 0700) Resp:  [12-25] 16  (01/19 0700) BP: (106-162)/(67-89) 130/78 mmHg (01/19 0700) SpO2:  [95 %-100 %] 98 % (01/19 0700) FiO2 (%):  [29.8 %-80.6 %] 30.1 % (01/19 0700) Weight:  [73.6 kg (162 lb 4.1 oz)] 73.6 kg (162 lb 4.1 oz) (01/19 0300)  Intake/Output from previous day: 01/18 0701 - 01/19 0700 In: 1623.5 [I.V.:1373.5; IV Piggyback:250] Out: 1330 [Urine:1330] Intake/Output this shift:    sedated/intubated-moves all 4 spont. , no FC     Lab Results:  Basename 12/28/11 0420 12/27/11 0350  WBC 9.7 11.3*  HGB 8.9* 8.5*  HCT 25.9* 24.4*  PLT 261 223   BMET  Basename 12/27/11 0350 12/26/11 0500  NA 131* 132*  K 3.8 3.5  CL 97 99  CO2 28 27  GLUCOSE 128* 147*  BUN 10 10  CREATININE 0.53 0.52  CALCIUM 8.7 8.6      Assessment/Plan: No change - CPM  LOS: 7 days     Jakai Risse R, MD 12/28/2011, 7:58 AM

## 2011-12-28 NOTE — Progress Notes (Signed)
Follow up - Trauma and Critical Care  Patient Details:    Dakota Stewart is an 66 y.o. male.  Lines/tubes : Airway 7.5 mm (Active)  Secured at (cm) 25 cm 12/27/2011  8:00 PM  Measured From Lips 12/27/2011  8:00 PM  Secured Location Right 12/28/2011  8:19 AM  Secured By [employer] 12/28/2011  8:19 AM  Tube Holder Repositioned Yes 12/28/2011  8:19 AM  Cuff Pressure (cm H2O) 20 cm H2O 12/27/2011  8:00 PM  Site Condition Dry 12/27/2011  8:00 PM     PICC Triple Lumen 12/24/11 PICC Right Basilic (Active)  Site Assessment Clean;Dry;Bleeding 12/27/2011  8:00 PM  Lumen #1 Status Infusing 12/27/2011  8:00 PM  Lumen #2 Status Saline locked 12/27/2011  8:00 PM  Lumen #3 Status Infusing 12/27/2011  8:00 PM  Dressing Type Transparent 12/27/2011  8:00 PM  Dressing Status Clean;Dry;Intact 12/27/2011  8:00 PM  Dressing Change Due 01/01/12 12/25/2011  8:00 PM  Indication for Insertion or Continuance of Line Prolonged intravenous therapies 12/27/2011  8:00 PM     NG/OG Tube Orogastric 16 Fr. Center mouth (Active)  Placement Verification Auscultation 12/27/2011  8:00 PM  Site Assessment Clean;Dry;Intact 12/27/2011  8:00 PM  Status Suction-low intermittent 12/27/2011  8:00 PM  Drainage Appearance Serosanguineous 12/27/2011  8:00 PM  Gastric Residual 40 mL 12/27/2011  8:00 AM  Intake (mL) 0 mL 12/27/2011  6:00 AM  Output (mL) 50 mL 12/22/2011 11:00 AM     Urethral Catheter Temperature probe 16 Fr. (Active)  Site Assessment Clean;Intact;Air leak 12/27/2011  8:00 PM  Collection Container Standard drainage bag 12/27/2011  8:00 PM  Securement Method Leg strap 12/27/2011  8:00 PM  Urinary Catheter Interventions Unclamped 12/26/2011  8:00 PM  Indication for Insertion or Continuance of Catheter Urinary output monitoring 12/26/2011  8:00 PM    Microbiology/Sepsis markers: Results for orders placed during the hospital encounter of 12/21/11  CULTURE, BAL-QUANTITATIVE     Status: Normal   Collection Time   12/23/11 12:27 PM      Component Value Range Status Comment   Specimen Description BRONCHIAL ALVEOLAR LAVAGE   Final    Special Requests NONE   Final    Gram Stain     Final    Value: FEW WBC PRESENT, PREDOMINANTLY PMN     NO SQUAMOUS EPITHELIAL CELLS SEEN     NO ORGANISMS SEEN   Colony Count >=100,000 COLONIES/ML   Final    Culture     Final    Value: MORAXELLA CATARRHALIS(BRANHAMELLA)     Note: BETA LACTAMASE POSITIVE   Report Status 12/25/2011 FINAL   Final     Anti-infectives:  Anti-infectives     Start     Dose/Rate Route Frequency Ordered Stop   12/26/11 1000   moxifloxacin (AVELOX) IVPB 400 mg        400 mg 250 mL/hr over 60 Minutes Intravenous Every 24 hours 12/26/11 0750     12/22/11 1230   piperacillin-tazobactam (ZOSYN) IVPB 3.375 g  Status:  Discontinued        3.375 g 12.5 mL/hr over 240 Minutes Intravenous Every 8 hours 12/22/11 1155 12/26/11 0750          Best Practice/Protocols:  VTE Prophylaxis: Mechanical GI Prophylaxis: Proton Pump Inhibitor Continous Sedation  Consults: Treatment Team:  Karn Cassis, MD    Events:  Subjective:    Overnight Issues: No issues overnight  Objective:  Vital signs for last 24 hours: Temp:  [98.4  F (36.9 C)-100.4 F (38 C)] 100 F (37.8 C) (01/19 0700) Pulse Rate:  [64-94] 68  (01/19 0819) Resp:  [12-25] 15  (01/19 0819) BP: (106-162)/(67-87) 122/75 mmHg (01/19 0819) SpO2:  [96 %-100 %] 100 % (01/19 0819) FiO2 (%):  [29.8 %-80.6 %] 30 % (01/19 0819) Weight:  [73.6 kg (162 lb 4.1 oz)] 73.6 kg (162 lb 4.1 oz) (01/19 0300)  Hemodynamic parameters for last 24 hours:    Intake/Output from previous day: 01/18 0701 - 01/19 0700 In: 1623.5 [I.V.:1373.5; IV Piggyback:250] Out: 1330 [Urine:1330]  Intake/Output this shift:    Vent settings for last 24 hours: Vent Mode:  [-] CPAP FiO2 (%):  [29.8 %-80.6 %] 30 % Set Rate:  [16 bmp] 16 bmp PEEP:  [5 cmH20] 5 cmH20 Pressure Support:  [5 cmH20-10 cmH20] 5  cmH20 Plateau Pressure:  [15 cmH20-18 cmH20] 18 cmH20  Physical Exam:  General: no respiratory distress and Weaning and doing well.  Opens eyes to verbal stimulus Neuro: nonfocal exam, RASS -2 and Opens eyes to verbal stimulus HEENT/Neck: ETT Resp: clear to auscultation bilaterally GI: distended and good bowel sounds but no BM Extremities: no edema, no erythema, pulses WNL Generally in no distress  Results for orders placed during the hospital encounter of 12/21/11 (from the past 24 hour(s))  GLUCOSE, CAPILLARY     Status: Abnormal   Collection Time   12/27/11 12:22 PM      Component Value Range   Glucose-Capillary 103 (*) 70 - 99 (mg/dL)  GLUCOSE, CAPILLARY     Status: Abnormal   Collection Time   12/27/11  3:50 PM      Component Value Range   Glucose-Capillary 106 (*) 70 - 99 (mg/dL)   Comment 1 Notify RN     Comment 2 Documented in Chart    GLUCOSE, CAPILLARY     Status: Normal   Collection Time   12/27/11  7:46 PM      Component Value Range   Glucose-Capillary 89  70 - 99 (mg/dL)   Comment 1 Notify RN     Comment 2 Documented in Chart    GLUCOSE, CAPILLARY     Status: Abnormal   Collection Time   12/27/11 11:52 PM      Component Value Range   Glucose-Capillary 109 (*) 70 - 99 (mg/dL)  GLUCOSE, CAPILLARY     Status: Abnormal   Collection Time   12/28/11  3:42 AM      Component Value Range   Glucose-Capillary 119 (*) 70 - 99 (mg/dL)  CBC     Status: Abnormal   Collection Time   12/28/11  4:20 AM      Component Value Range   WBC 9.7  4.0 - 10.5 (K/uL)   RBC 2.73 (*) 4.22 - 5.81 (MIL/uL)   Hemoglobin 8.9 (*) 13.0 - 17.0 (g/dL)   HCT 45.4 (*) 09.8 - 52.0 (%)   MCV 94.9  78.0 - 100.0 (fL)   MCH 32.6  26.0 - 34.0 (pg)   MCHC 34.4  30.0 - 36.0 (g/dL)   RDW 11.9  14.7 - 82.9 (%)   Platelets 261  150 - 400 (K/uL)  BASIC METABOLIC PANEL     Status: Abnormal   Collection Time   12/28/11  4:20 AM      Component Value Range   Sodium 133 (*) 135 - 145 (mEq/L)   Potassium 4.0   3.5 - 5.1 (mEq/L)   Chloride 97  96 - 112 (mEq/L)  CO2 26  19 - 32 (mEq/L)   Glucose, Bld 125 (*) 70 - 99 (mg/dL)   BUN 12  6 - 23 (mg/dL)   Creatinine, Ser 6.29  0.50 - 1.35 (mg/dL)   Calcium 8.4  8.4 - 52.8 (mg/dL)   GFR calc non Af Amer >90  >90 (mL/min)   GFR calc Af Amer >90  >90 (mL/min)  GLUCOSE, CAPILLARY     Status: Normal   Collection Time   12/28/11  8:02 AM      Component Value Range   Glucose-Capillary 99  70 - 99 (mg/dL)     Assessment/Plan:   NEURO  Altered Mental Status:  sedation   Plan: Back off on sedation slowly, try to target extubation early next week.  PULM  No problems   Plan: Continue to wean  CARDIO  No issues   Plan: CPM  RENAL  No issues   Plan: CPM  GI  Vomiting and had bben intolerant of tube feedings, but now abdomen is soft and less distended. Protein-Calorie Malnutrition   Plan: Will start tube feedings again.  ID  Pneumonia (hospital acquired (not ventilator-associated) Moraxella)   Plan: Continue appropriate antibiotics  HEME  Anemia acute blood loss anemia and anemia of critical illness)   Plan: No plans for transfusion at this point  ENDO No issues   Plan: CPM  Global Issues      LOS: 7 days   Additional comments:I reviewed the patient's new clinical lab test results. CBC and Bmet and I reviewed the patients new imaging test results. Abdominal Xrays  Critical Care Total Time*: 30 Minutes  Ladashia Demarinis III,Brandi Armato O 12/28/2011  *Care during the described time interval was provided by me and/or other providers on the critical care team.  I have reviewed this patient's available data, including medical history, events of note, physical examination and test results as part of my evaluation.

## 2011-12-29 ENCOUNTER — Inpatient Hospital Stay (HOSPITAL_COMMUNITY): Payer: Medicare Other

## 2011-12-29 LAB — BASIC METABOLIC PANEL
Chloride: 93 mEq/L — ABNORMAL LOW (ref 96–112)
Creatinine, Ser: 0.51 mg/dL (ref 0.50–1.35)
GFR calc Af Amer: >90 mL/min (ref >90)
GFR calc non Af Amer: >90 mL/min (ref >90)

## 2011-12-29 LAB — DIFFERENTIAL
Basophils Absolute: 0.1 10*3/uL (ref 0.0–0.1)
Basophils Relative: 1 % (ref 0–1)
Eosinophils Absolute: 0.1 10*3/uL (ref 0.0–0.7)
Monocytes Absolute: 1.3 10*3/uL — ABNORMAL HIGH (ref 0.1–1.0)
Neutro Abs: 5.3 10*3/uL (ref 1.7–7.7)
Neutrophils Relative %: 63 % (ref 43–77)

## 2011-12-29 LAB — CBC
HCT: 22.9 % — ABNORMAL LOW (ref 39.0–52.0)
MCHC: 35.8 g/dL (ref 30.0–36.0)
RDW: 12.5 % (ref 11.5–15.5)

## 2011-12-29 MED ORDER — PIVOT 1.5 CAL PO LIQD
1000.0000 mL | ORAL | Status: DC
  Administered 2011-12-29: 1000 mL
  Filled 2011-12-29 (×2): qty 1000

## 2011-12-29 MED ORDER — PIVOT 1.5 CAL PO LIQD
1000.0000 mL | ORAL | Status: AC
  Filled 2011-12-29 (×2): qty 1000

## 2011-12-29 MED ORDER — POTASSIUM CHLORIDE 10 MEQ/100ML IV SOLN
10.0000 meq | INTRAVENOUS | Status: AC
  Administered 2011-12-29 (×4): 10 meq via INTRAVENOUS
  Filled 2011-12-29: qty 400

## 2011-12-29 NOTE — Progress Notes (Signed)
Subjective: Patient intubated , sedated  Objective: Vital signs in last 24 hours: Temp:  [98.1 F (36.7 C)-99.9 F (37.7 C)] 98.1 F (36.7 C) (01/20 0800) Pulse Rate:  [67-130] 67  (01/20 0746) Resp:  [14-20] 16  (01/20 0746) BP: (127-176)/(69-112) 127/80 mmHg (01/20 0746) SpO2:  [97 %-100 %] 100 % (01/20 0746) FiO2 (%):  [29.8 %-30.5 %] 30 % (01/20 0746) Weight:  [67.6 kg (149 lb 0.5 oz)] 67.6 kg (149 lb 0.5 oz) (01/20 0500)  Intake/Output from previous day: 01/19 0701 - 01/20 0700 In: 2362.6 [I.V.:2000.6; NG/GT:100; IV Piggyback:262] Out: 3142 [Urine:3140; Stool:2] Intake/Output this shift:    opens eys to voice - , not FC , moves all 4 - no change    Assessment/Plan: No sig change - CPM  LOS: 8 days     Erricka Falkner R, MD 12/29/2011, 8:32 AM

## 2011-12-29 NOTE — Progress Notes (Signed)
Patient is resting in bed. Call MD on call to  notified of am labs,  Sodium of 126. Informed to order labs (BMET, CBC).

## 2011-12-29 NOTE — Progress Notes (Signed)
Patient ID: Dakota Stewart, male   DOB: [birth year], 66 y.o.   MRN: [id number] Follow up - Trauma and Critical Care  Patient Details:    Dakota Stewart is an 66 y.o. male.  Lines/tubes : Airway 7.5 mm (Active)  Secured at (cm) 25 cm 12/27/2011  8:00 PM  Measured From Lips 12/27/2011  8:00 PM  Secured Location Right 12/28/2011  8:19 AM  Secured By [employer] 12/28/2011  8:19 AM  Tube Holder Repositioned Yes 12/28/2011  8:19 AM  Cuff Pressure (cm H2O) 20 cm H2O 12/27/2011  8:00 PM  Site Condition Dry 12/27/2011  8:00 PM     PICC Triple Lumen 12/24/11 PICC Right Basilic (Active)  Site Assessment Clean;Dry;Bleeding 12/27/2011  8:00 PM  Lumen #1 Status Infusing 12/27/2011  8:00 PM  Lumen #2 Status Saline locked 12/27/2011  8:00 PM  Lumen #3 Status Infusing 12/27/2011  8:00 PM  Dressing Type Transparent 12/27/2011  8:00 PM  Dressing Status Clean;Dry;Intact 12/27/2011  8:00 PM  Dressing Change Due 01/01/12 12/25/2011  8:00 PM  Indication for Insertion or Continuance of Line Prolonged intravenous therapies 12/27/2011  8:00 PM     NG/OG Tube Orogastric 16 Fr. Center mouth (Active)  Placement Verification Auscultation 12/27/2011  8:00 PM  Site Assessment Clean;Dry;Intact 12/27/2011  8:00 PM  Status Suction-low intermittent 12/27/2011  8:00 PM  Drainage Appearance Serosanguineous 12/27/2011  8:00 PM  Gastric Residual 40 mL 12/27/2011  8:00 AM  Intake (mL) 0 mL 12/27/2011  6:00 AM  Output (mL) 50 mL 12/22/2011 11:00 AM     Urethral Catheter Temperature probe 16 Fr. (Active)  Site Assessment Clean;Intact;Air leak 12/27/2011  8:00 PM  Collection Container Standard drainage bag 12/27/2011  8:00 PM  Securement Method Leg strap 12/27/2011  8:00 PM  Urinary Catheter Interventions Unclamped 12/26/2011  8:00 PM  Indication for Insertion or Continuance of Catheter Urinary output monitoring 12/26/2011  8:00 PM    Microbiology/Sepsis markers: Results for orders placed during the hospital  encounter of 12/21/11  CULTURE, BAL-QUANTITATIVE     Status: Normal   Collection Time   12/23/11 12:27 PM      Component Value Range Status Comment   Specimen Description BRONCHIAL ALVEOLAR LAVAGE   Final    Special Requests NONE   Final    Gram Stain     Final    Value: FEW WBC PRESENT, PREDOMINANTLY PMN     NO SQUAMOUS EPITHELIAL CELLS SEEN     NO ORGANISMS SEEN   Colony Count >=100,000 COLONIES/ML   Final    Culture     Final    Value: MORAXELLA CATARRHALIS(BRANHAMELLA)     Note: BETA LACTAMASE POSITIVE   Report Status 12/25/2011 FINAL   Final     Anti-infectives:  Anti-infectives     Start     Dose/Rate Route Frequency Ordered Stop   12/26/11 1000   moxifloxacin (AVELOX) IVPB 400 mg        400 mg 250 mL/hr over 60 Minutes Intravenous Every 24 hours 12/26/11 0750     12/22/11 1230   piperacillin-tazobactam (ZOSYN) IVPB 3.375 g  Status:  Discontinued        3.375 g 12.5 mL/hr over 240 Minutes Intravenous Every 8 hours 12/22/11 1155 12/26/11 0750          Best Practice/Protocols:  VTE Prophylaxis: Mechanical GI Prophylaxis: Proton Pump Inhibitor Continous Sedation  Consults: Treatment Team:  Dakota Cassis, MD    Events:  Subjective:  Overnight Issues: No issues overnight.  Working on Social research officer, government.  Will try to sit up in bed.  Spontaneously opens eyes and moves extremities, does not follow commands.    Objective:  Vital signs for last 24 hours: Temp:  [98.1 F (36.7 C)-99.9 F (37.7 C)] 98.1 F (36.7 C) (01/20 0800) Pulse Rate:  [67-130] 73  (01/20 0843) Resp:  [14-20] 17  (01/20 0843) BP: (127-176)/(69-112) 140/85 mmHg (01/20 0843) SpO2:  [97 %-100 %] 100 % (01/20 0843) FiO2 (%):  [29.8 %-30.5 %] 29.9 % (01/20 0843) Weight:  [149 lb 0.5 oz (67.6 kg)] 149 lb 0.5 oz (67.6 kg) (01/20 0500)  Hemodynamic parameters for last 24 hours:    Intake/Output from previous day: 01/19 0701 - 01/20 0700 In: 2362.6 [I.V.:2000.6; NG/GT:100; IV Piggyback:262] Out:  3142 [Urine:3140; Stool:2]  Intake/Output this shift:    Vent settings for last 24 hours: Vent Mode:  [-] CPAP FiO2 (%):  [29.8 %-30.5 %] 29.9 % Set Rate:  [16 bmp] 16 bmp PEEP:  [4.5 cmH20-5 cmH20] 4.5 cmH20 Pressure Support:  [10 cmH20] 10 cmH20 Plateau Pressure:  [17 cmH20] 17 cmH20  Physical Exam:  General: no respiratory distress and Weaning and doing well.  Opens eyes to verbal stimulus Neuro: nonfocal exam, RASS -2 and Opens eyes to verbal stimulus HEENT/Neck: ETT Resp: clear to auscultation bilaterally GI: distended and good bowel sounds but no BM Extremities: no edema, no erythema, pulses WNL Generally in no distress  Results for orders placed during the hospital encounter of 12/21/11 (from the past 24 hour(s))  GLUCOSE, CAPILLARY     Status: Abnormal   Collection Time   12/28/11 12:11 PM      Component Value Range   Glucose-Capillary 102 (*) 70 - 99 (mg/dL)  GLUCOSE, CAPILLARY     Status: Abnormal   Collection Time   12/28/11  3:25 PM      Component Value Range   Glucose-Capillary 111 (*) 70 - 99 (mg/dL)  GLUCOSE, CAPILLARY     Status: Abnormal   Collection Time   12/28/11  7:37 PM      Component Value Range   Glucose-Capillary 134 (*) 70 - 99 (mg/dL)  GLUCOSE, CAPILLARY     Status: Abnormal   Collection Time   12/28/11 11:31 PM      Component Value Range   Glucose-Capillary 112 (*) 70 - 99 (mg/dL)  GLUCOSE, CAPILLARY     Status: Normal   Collection Time   12/29/11  3:54 AM      Component Value Range   Glucose-Capillary 98  70 - 99 (mg/dL)  CBC     Status: Abnormal   Collection Time   12/29/11  5:07 AM      Component Value Range   WBC 8.3  4.0 - 10.5 (K/uL)   RBC 2.48 (*) 4.22 - 5.81 (MIL/uL)   Hemoglobin 8.2 (*) 13.0 - 17.0 (g/dL)   HCT 57.8 (*) 46.9 - 52.0 (%)   MCV 92.3  78.0 - 100.0 (fL)   MCH 33.1  26.0 - 34.0 (pg)   MCHC 35.8  30.0 - 36.0 (g/dL)   RDW 62.9  52.8 - 41.3 (%)   Platelets 248  150 - 400 (K/uL)  DIFFERENTIAL     Status: Abnormal    Collection Time   12/29/11  5:07 AM      Component Value Range   Neutrophils Relative 63  43 - 77 (%)   Neutro Abs 5.3  1.7 - 7.7 (K/uL)  Lymphocytes Relative 18  12 - 46 (%)   Lymphs Abs 1.5  0.7 - 4.0 (K/uL)   Monocytes Relative 16 (*) 3 - 12 (%)   Monocytes Absolute 1.3 (*) 0.1 - 1.0 (K/uL)   Eosinophils Relative 2  0 - 5 (%)   Eosinophils Absolute 0.1  0.0 - 0.7 (K/uL)   Basophils Relative 1  0 - 1 (%)   Basophils Absolute 0.1  0.0 - 0.1 (K/uL)  BASIC METABOLIC PANEL     Status: Abnormal   Collection Time   12/29/11  5:07 AM      Component Value Range   Sodium 126 (*) 135 - 145 (mEq/L)   Potassium 3.2 (*) 3.5 - 5.1 (mEq/L)   Chloride 93 (*) 96 - 112 (mEq/L)   CO2 27  19 - 32 (mEq/L)   Glucose, Bld 119 (*) 70 - 99 (mg/dL)   BUN 8  6 - 23 (mg/dL)   Creatinine, Ser 7.82  0.50 - 1.35 (mg/dL)   Calcium 8.5  8.4 - 95.6 (mg/dL)   GFR calc non Af Amer >90  >90 (mL/min)   GFR calc Af Amer >90  >90 (mL/min)  GLUCOSE, CAPILLARY     Status: Abnormal   Collection Time   12/29/11  7:51 AM      Component Value Range   Glucose-Capillary 105 (*) 70 - 99 (mg/dL)     Assessment/Plan:   NEURO  Altered Mental Status:  sedation   Plan: Sedation decreased today.  PULM  No problems   Plan: Continue to wean, some tan secretions.  CARDIO  No issues   Plan: CPM  RENAL  No issues   Plan: CPM  GI  Vomiting and had bben intolerant of tube feedings, but now abdomen is soft and less distended. Protein-Calorie Malnutrition   Plan: Will restart feeds.    ID  Pneumonia (hospital acquired (not ventilator-associated) Moraxella)   Plan: Continue appropriate antibiotics  HEME  Anemia acute blood loss anemia and anemia of critical illness)   Plan: No plans for transfusion at this point  ENDO No issues   Plan: CPM  FEN  Hyponatremia.  Check fluid balance.  May need to cut back on IVF Hypokalemia.  Replete.      LOS: 8 days   Additional comments:I reviewed the patient's new clinical lab  test results. CBC and Bmet and I reviewed the patients new imaging test results. Abdominal Xrays    Dakota Stewart 12/29/2011

## 2011-12-30 ENCOUNTER — Encounter (HOSPITAL_COMMUNITY): Payer: Self-pay | Admitting: Anesthesiology

## 2011-12-30 ENCOUNTER — Inpatient Hospital Stay (HOSPITAL_COMMUNITY): Payer: Medicare Other

## 2011-12-30 ENCOUNTER — Encounter (HOSPITAL_COMMUNITY): Payer: Self-pay

## 2011-12-30 ENCOUNTER — Encounter (HOSPITAL_COMMUNITY): Payer: Self-pay | Admitting: Ophthalmology

## 2011-12-30 DIAGNOSIS — H27119 Subluxation of lens, unspecified eye: Secondary | ICD-10-CM | POA: Diagnosis present

## 2011-12-30 LAB — BASIC METABOLIC PANEL WITH GFR
BUN: 9 mg/dL (ref 6–23)
CO2: 29 meq/L (ref 19–32)
Calcium: 8.6 mg/dL (ref 8.4–10.5)
Chloride: 91 meq/L — ABNORMAL LOW (ref 96–112)
Creatinine, Ser: 0.5 mg/dL (ref 0.50–1.35)
GFR calc Af Amer: >90 mL/min
GFR calc non Af Amer: >90 mL/min
Glucose, Bld: 127 mg/dL — ABNORMAL HIGH (ref 70–99)
Potassium: 3.1 meq/L — ABNORMAL LOW (ref 3.5–5.1)
Sodium: 124 meq/L — ABNORMAL LOW (ref 135–145)

## 2011-12-30 LAB — BASIC METABOLIC PANEL
CO2: 28 mEq/L (ref 19–32)
Chloride: 95 mEq/L — ABNORMAL LOW (ref 96–112)
Creatinine, Ser: 0.54 mg/dL (ref 0.50–1.35)
Potassium: 3.7 mEq/L (ref 3.5–5.1)

## 2011-12-30 LAB — GLUCOSE, CAPILLARY
Glucose-Capillary: 144 mg/dL — ABNORMAL HIGH (ref 70–99)
Glucose-Capillary: 154 mg/dL — ABNORMAL HIGH (ref 70–99)
Glucose-Capillary: 97 mg/dL (ref 70–99)
Glucose-Capillary: 163 mg/dL — ABNORMAL HIGH (ref 70–99)

## 2011-12-30 LAB — CBC
Hemoglobin: 8.4 g/dL — ABNORMAL LOW (ref 13.0–17.0)
MCH: 32.4 pg (ref 26.0–34.0)
MCV: 91.1 fL (ref 78.0–100.0)
RBC: 2.59 MIL/uL — ABNORMAL LOW (ref 4.22–5.81)

## 2011-12-30 MED ORDER — POTASSIUM CHLORIDE 10 MEQ/50ML IV SOLN
INTRAVENOUS | Status: AC
  Administered 2011-12-30: 10 meq via INTRAVENOUS
  Filled 2011-12-30: qty 50

## 2011-12-30 MED ORDER — SODIUM CHLORIDE 1 G PO TABS
1.0000 g | ORAL_TABLET | Freq: Two times a day (BID) | ORAL | Status: DC
  Administered 2011-12-30 – 2012-01-07 (×17): 1 g
  Filled 2011-12-30 (×18): qty 1

## 2011-12-30 MED ORDER — POTASSIUM CHLORIDE 10 MEQ/100ML IV SOLN: 10.0000 meq | INTRAVENOUS | Status: DC

## 2011-12-30 MED ORDER — SODIUM CHLORIDE 3 % IV SOLN
INTRAVENOUS | Status: DC
  Administered 2011-12-30: 09:00:00 via INTRAVENOUS
  Filled 2011-12-30: qty 500

## 2011-12-30 MED ORDER — POTASSIUM CHLORIDE 10 MEQ/50ML IV SOLN
INTRAVENOUS | Status: AC
  Filled 2011-12-30: qty 50

## 2011-12-30 MED ORDER — SODIUM CHLORIDE 0.9 % IV SOLN
INTRAVENOUS | Status: DC
  Administered 2012-01-01 – 2012-01-03 (×3): via INTRAVENOUS
  Administered 2012-01-04: 20 mL/h via INTRAVENOUS
  Administered 2012-01-05: 20:00:00 via INTRAVENOUS

## 2011-12-30 MED ORDER — POTASSIUM CHLORIDE 10 MEQ/50ML IV SOLN
10.0000 meq | INTRAVENOUS | Status: AC
  Administered 2011-12-30 (×4): 10 meq via INTRAVENOUS
  Filled 2011-12-30 (×2): qty 50
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
## 2011-12-30 NOTE — Progress Notes (Signed)
Clinical Social Worker spoke with MD who requested family meeting to discuss further plans of trach/PEG placement.  CSW contacted patient son and daughter who will be present in patient room at 4:00pm today - MD notified.  Clinical Social Worker will be available at the time of the meeting for emotional support and to address concerns regarding patient placement needs.  CSW available for support and discharge planning needs.  [address] [place], [place] [phone]

## 2011-12-30 NOTE — Progress Notes (Signed)
Spoke with two daughters and one son of pt about what process would be as pt begins to wean from the ventilator and the likelihood that he would need SNF at d/c. One daughter works at [employer] and wishes for that to be the first choice.  I explained to them that we would make every effort but that there was a possibility that they would have to consider other choices depending on availability. They stated understanding. Will notify CSW of this.
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
## 2011-12-30 NOTE — Progress Notes (Signed)
Patient ID: Dakota Stewart, male   DOB: [birth year], 66 y.o.   MRN: [id number] I met with members of our team and the patient's son and daughter.  We discussed the current clinical situation and future plan.  We plan trach and PEG in the OR tomorrow.  Procedure, risks, benefits were discussed at length. Questions were answered.  They agree with the plan.  Violeta Gelinas, MD, MPH, FACS Pager: [phone]

## 2011-12-31 ENCOUNTER — Inpatient Hospital Stay (HOSPITAL_COMMUNITY): Payer: Medicare Other

## 2011-12-31 ENCOUNTER — Encounter (HOSPITAL_COMMUNITY): Payer: Self-pay | Admitting: Anesthesiology

## 2011-12-31 ENCOUNTER — Inpatient Hospital Stay (HOSPITAL_COMMUNITY): Payer: Medicare Other | Admitting: Anesthesiology

## 2011-12-31 ENCOUNTER — Encounter (HOSPITAL_COMMUNITY)
Admission: EM | Disposition: A | Discharge status: Nursing Home (Includes ICF) | Payer: Self-pay | Source: Ambulatory Visit

## 2011-12-31 HISTORY — PX: PEG PLACEMENT: SHX5437

## 2011-12-31 HISTORY — PX: PERCUTANEOUS TRACHEOSTOMY: SHX5288

## 2011-12-31 LAB — BASIC METABOLIC PANEL
BUN: 11 mg/dL (ref 6–23)
CO2: 28 mEq/L (ref 19–32)
CO2: 27 mEq/L (ref 19–32)
Calcium: 8.1 mg/dL — ABNORMAL LOW (ref 8.4–10.5)
Calcium: 8.2 mg/dL — ABNORMAL LOW (ref 8.4–10.5)
Chloride: 106 mEq/L (ref 96–112)
Chloride: 108 mEq/L (ref 96–112)
Chloride: 100 mEq/L (ref 96–112)
Creatinine, Ser: 0.57 mg/dL (ref 0.50–1.35)
GFR calc Af Amer: >90 mL/min (ref >90)
GFR calc Af Amer: >90 mL/min (ref >90)
GFR calc non Af Amer: >90 mL/min (ref >90)
GFR calc non Af Amer: >90 mL/min (ref >90)
Glucose, Bld: 116 mg/dL — ABNORMAL HIGH (ref 70–99)
Potassium: 3.5 mEq/L (ref 3.5–5.1)
Potassium: 3.4 mEq/L — ABNORMAL LOW (ref 3.5–5.1)
Potassium: 3.5 mEq/L (ref 3.5–5.1)
Sodium: 140 mEq/L (ref 135–145)
Sodium: 141 mEq/L (ref 135–145)

## 2011-12-31 LAB — CBC
HCT: 20.5 % — ABNORMAL LOW (ref 39.0–52.0)
MCV: 91.5 fL (ref 78.0–100.0)
RBC: 2.24 MIL/uL — ABNORMAL LOW (ref 4.22–5.81)
WBC: 7.7 10*3/uL (ref 4.0–10.5)

## 2011-12-31 LAB — GLUCOSE, CAPILLARY
Glucose-Capillary: 113 mg/dL — ABNORMAL HIGH (ref 70–99)
Glucose-Capillary: 113 mg/dL — ABNORMAL HIGH (ref 70–99)
Glucose-Capillary: 115 mg/dL — ABNORMAL HIGH (ref 70–99)
Glucose-Capillary: 88 mg/dL (ref 70–99)
Glucose-Capillary: 87 mg/dL (ref 70–99)
Glucose-Capillary: 107 mg/dL — ABNORMAL HIGH (ref 70–99)

## 2011-12-31 SURGERY — CREATION, TRACHEOSTOMY, PERCUTANEOUS
Anesthesia: General | Site: Neck | Wound class: Clean Contaminated

## 2011-12-31 MED ORDER — FENTANYL CITRATE 0.05 MG/ML IJ SOLN
INTRAMUSCULAR | Status: DC | PRN
  Administered 2011-12-31: 150 ug via INTRAVENOUS
  Administered 2011-12-31: 100 ug via INTRAVENOUS

## 2011-12-31 MED ORDER — 0.9 % SODIUM CHLORIDE (POUR BTL) OPTIME
TOPICAL | Status: DC | PRN
  Administered 2011-12-31: 1000 mL

## 2011-12-31 MED ORDER — LACTATED RINGERS IV SOLN
INTRAVENOUS | Status: DC | PRN
  Administered 2011-12-31: 13:00:00 via INTRAVENOUS

## 2011-12-31 MED ORDER — BACITRACIN-NEOMYCIN-POLYMYXIN OINTMENT TUBE
TOPICAL_OINTMENT | Freq: Three times a day (TID) | CUTANEOUS | Status: DC
  Administered 2011-12-31 – 2012-01-07 (×21): via TOPICAL
  Filled 2011-12-31 (×2): qty 15

## 2011-12-31 MED ORDER — ROCURONIUM BROMIDE 100 MG/10ML IV SOLN
INTRAVENOUS | Status: DC | PRN
  Administered 2011-12-31: 50 mg via INTRAVENOUS
  Administered 2011-12-31: 30 mg via INTRAVENOUS
  Administered 2011-12-31: 20 mg via INTRAVENOUS
  Administered 2011-12-31: 50 mg via INTRAVENOUS

## 2011-12-31 MED ORDER — ALTEPLASE 100 MG IV SOLR
2.0000 mg | Freq: Once | INTRAVENOUS | Status: AC
  Administered 2011-12-31: 2 mg
  Filled 2011-12-31: qty 2

## 2011-12-31 MED ORDER — PROPOFOL 10 MG/ML IV EMUL
INTRAVENOUS | Status: DC | PRN
  Administered 2011-12-31: 50 mg via INTRAVENOUS
  Administered 2011-12-31: 100 mg via INTRAVENOUS

## 2011-12-31 SURGICAL SUPPLY — 33 items
DRAPE PED LAPAROTOMY (DRAPES) ×3 IMPLANT
DRAPE PROXIMA HALF (DRAPES) IMPLANT
DRAPE UTILITY 15X26 W/TAPE STR (DRAPE) ×6 IMPLANT
ELECT CAUTERY BLADE 6.4 (BLADE) ×3 IMPLANT
ELECT REM PT RETURN 9FT ADLT (ELECTROSURGICAL) ×3
ELECTRODE REM PT RTRN 9FT ADLT (ELECTROSURGICAL) ×2 IMPLANT
GAUZE SPONGE 4X4 16PLY XRAY LF (GAUZE/BANDAGES/DRESSINGS) ×3 IMPLANT
GLOVE BIO SURGEON STRL SZ 6.5 (GLOVE) ×6 IMPLANT
GLOVE BIO SURGEON STRL SZ8 (GLOVE) ×12 IMPLANT
GLOVE BIOGEL PI IND STRL 6.5 (GLOVE) ×2 IMPLANT
GLOVE BIOGEL PI IND STRL 7.0 (GLOVE) ×4 IMPLANT
GLOVE BIOGEL PI IND STRL 8 (GLOVE) ×6 IMPLANT
GLOVE BIOGEL PI INDICATOR 6.5 (GLOVE) ×1
GLOVE BIOGEL PI INDICATOR 7.0 (GLOVE) ×2
GLOVE BIOGEL PI INDICATOR 8 (GLOVE) ×3
GLOVE ECLIPSE 6.5 STRL STRAW (GLOVE) IMPLANT
GLOVE SURG SS PI 6.5 STRL IVOR (GLOVE) ×3 IMPLANT
GOWN PREVENTION PLUS XLARGE (GOWN DISPOSABLE) ×9 IMPLANT
GOWN STRL NON-REIN LRG LVL3 (GOWN DISPOSABLE) ×6 IMPLANT
INTRODUCER TRACH BLUE RHINO 6F (TUBING) ×3 IMPLANT
INTRODUCER TRACH BLUE RHINO 8F (TUBING) IMPLANT
KIT BASIN OR (CUSTOM PROCEDURE TRAY) ×3 IMPLANT
PACK SURGICAL SETUP 50X90 (CUSTOM PROCEDURE TRAY) ×3 IMPLANT
PAD ARMBOARD 7.5X6 YLW CONV (MISCELLANEOUS) ×6 IMPLANT
PENCIL BUTTON HOLSTER BLD 10FT (ELECTRODE) ×3 IMPLANT
SPONGE DRAIN TRACH 4X4 STRL 2S (GAUZE/BANDAGES/DRESSINGS) ×3 IMPLANT
SPONGE INTESTINAL PEANUT (DISPOSABLE) ×3 IMPLANT
SUT SILK 3 0 SH CR/8 (SUTURE) ×3 IMPLANT
SUT VICRYL AB 3 0 TIES (SUTURE) ×3 IMPLANT
SYR BULB 3OZ (MISCELLANEOUS) ×3 IMPLANT
TOWEL OR 17X24 6PK STRL BLUE (TOWEL DISPOSABLE) ×3 IMPLANT
TUBE CONNECTING 12X1/4 (SUCTIONS) ×6 IMPLANT
YANKAUER SUCT BULB TIP NO VENT (SUCTIONS) ×3 IMPLANT

## 2011-12-31 NOTE — Anesthesia Preprocedure Evaluation (Addendum)
Anesthesia Evaluation  Patient identified by MRN, date of birth, ID band  Reviewed: Allergy & Precautions, H&P , NPO status , Patient's Chart, lab work & pertinent test results  Airway  TM Distance: >3 FB Neck ROM: full    Dental  (+) Poor Dentition   Pulmonary Current Smoker,          Cardiovascular regular Tachycardia    Neuro/Psych    GI/Hepatic negative GI ROS, Neg liver ROS, (+)     substance abuse  alcohol use,   Endo/Other    Renal/GU      Musculoskeletal   Abdominal   Peds  Hematology negative hematology ROS (+)   Anesthesia Other Findings   Reproductive/Obstetrics                         Anesthesia Physical Anesthesia Plan  ASA: III  Anesthesia Plan: General   Post-op Pain Management:    Induction: Inhalational  Airway Management Planned: Oral ETT and Tracheostomy  Additional Equipment:   Intra-op Plan:   Post-operative Plan: Possible Post-op intubation/ventilation and Post-operative intubation/ventilation  Informed Consent: I have reviewed the patients History and Physical, chart, labs and discussed the procedure including the risks, benefits and alternatives for the proposed anesthesia with the patient or authorized representative who has indicated his/her understanding and acceptance.   History available from chart only  Plan Discussed with: CRNA, Anesthesiologist and Surgeon  Anesthesia Plan Comments:        Anesthesia Quick Evaluation

## 2011-12-31 NOTE — Transfer of Care (Signed)
Immediate Anesthesia Transfer of Care Note  Patient: Dakota Stewart  Procedure(s) Performed:  PERCUTANEOUS TRACHEOSTOMY; PERCUTANEOUS ENDOSCOPIC GASTROSTOMY (PEG) PLACEMENT  Patient Location: PACU and ICU  Anesthesia Type: General  Level of Consciousness: sedated  Airway & Oxygen Therapy: Patient placed on Ventilator (see vital sign flow sheet for setting)  Post-op Assessment: Report given to PACU RN and Post -op Vital signs reviewed and stable  Post vital signs: Reviewed and stable  Complications: No apparent anesthesia complications

## 2011-12-31 NOTE — OR Nursing (Signed)
PEG placement began at 1306 and ended at 1318.  Pt was re-prepped and re-draped at 1321.  Trach procedure began at 1325 and ended at 1350.
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
## 2011-12-31 NOTE — Progress Notes (Signed)
Patient ID: Dakota Stewart, male   DOB: July 04, 1946, 66 y.o.   MRN: [id number] Patient had some blood from his mouth.  This has stopped now. He did have some bleeding in his throat after the OG was removed in the OR.  He had been coughing a lot and I suspect stirred this up.  Will monitor. Violeta Gelinas, MD, MPH, FACS Pager: [phone]

## 2011-12-31 NOTE — Preoperative (Signed)
Beta Blockers   Reason not to administer Beta Blockers:Not Applicable 
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
## 2011-12-31 NOTE — Progress Notes (Signed)
Clinical Social Worker received report from case manager who states patient family continues to be agreeable with SNF placement at discharge.  Patient had trach/PEG placed today and remains on ventilator support.  CSW will follow up with family and initiate SNF search once determined if patient will require long term ventilator support.    Clinical Social Worker remains available for support and discharge planning needs.  [address] [place], [place] [phone]
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
## 2011-12-31 NOTE — Anesthesia Postprocedure Evaluation (Signed)
  Anesthesia Post-op Note  Patient: Dakota Stewart  Procedure(s) Performed:  PERCUTANEOUS TRACHEOSTOMY; PERCUTANEOUS ENDOSCOPIC GASTROSTOMY (PEG) PLACEMENT  ICU  Anesthesia Type: General  Level of Consciousness: sedated  Airway and Oxygen Therapy: Patient placed on Ventilator (see vital sign flow sheet for setting)  Post-op Pain: none  Post-op Assessment: Post-op Vital signs reviewed, Patient's Cardiovascular Status Stable, Respiratory Function Stable, Patent Airway, No signs of Nausea or vomiting and Pain level controlled  Post-op Vital Signs: Reviewed and stable  Complications: No apparent anesthesia complications

## 2011-12-31 NOTE — Progress Notes (Signed)
Patient ID: Dakota Stewart, male   DOB: 1946/03/23, 66 y.o.   MRN: [id number] I spoke with the patient's daughter at bedside after the procedures today.  Patient weaning on CP/PS.

## 2011-12-31 NOTE — Progress Notes (Signed)
Nutrition Follow-up  Diet Order:  NPO  Meds: Scheduled Meds:   . albuterol-ipratropium  6 puff Inhalation Q4H  . antiseptic oral rinse  15 mL Mouth Rinse QID  . artificial tears  1 application Both Eyes Q8H  . bacitracin   Topical BID  . bisacodyl  10 mg Rectal Once  . chlorhexidine  15 mL Mouth Rinse BID  . clonazePAM  0.5 mg Oral Q8H  . feeding supplement (PIVOT 1.5 CAL)  1,000 mL Per Tube Q24H  . folic acid  1 mg Oral Daily  . insulin aspart  0-9 Units Subcutaneous Q4H  . metoCLOPramide (REGLAN) injection  10 mg Intravenous Q6H  . moxifloxacin  400 mg Intravenous Q24H  . pantoprazole sodium  40 mg Per Tube Daily  . potassium chloride  10 mEq Intravenous Q1 Hr x 4  . sodium chloride  10 mL Intracatheter Q12H  . sodium chloride  1 g Per Tube BID  . thiamine  100 mg Oral Daily   Or  . thiamine  100 mg Intravenous Daily  . tobramycin   Both Eyes BID   Continuous Infusions:   . sodium chloride 50 mL/hr at 12/31/11 0800  . fentaNYL infusion INTRAVENOUS 150 mcg/hr (12/31/11 0745)  . midazolam (VERSED) infusion 2 mg/hr (12/31/11 0745)  . DISCONTD: sodium chloride Stopped (12/30/11 0902)  . DISCONTD: sodium chloride 15 mL/hr (12/30/11 1508)   PRN Meds:.acetaminophen, bisacodyl, fentaNYL, ondansetron (ZOFRAN) IV, ondansetron, sodium chloride  Labs:  CMP     Component Value Date/Time   NA 134* 12/31/2011 0800   K 3.5 12/31/2011 0800   CL 100 12/31/2011 0800   CO2 28 12/31/2011 0800   GLUCOSE 111* 12/31/2011 0800   BUN 11 12/31/2011 0800   CREATININE 0.57 12/31/2011 0800   CALCIUM 8.4 12/31/2011 0800   PROT 6.0 12/24/2011 0930   ALBUMIN 2.2* 12/24/2011 0930   AST 70* 12/24/2011 0930   ALT 35 12/24/2011 0930   ALKPHOS 103 12/24/2011 0930   BILITOT 0.8 12/24/2011 0930   GFRNONAA >90 12/31/2011 0800   GFRAA >90 12/31/2011 0800     Intake/Output Summary (Last 24 hours) at 12/31/11 0920 Last data filed at 12/31/11 0900  Gross per 24 hour  Intake 2283.21 ml  Output   1480 ml  Net  803.21 ml   TF is currently being held for trach/PEG placement. Pt had trouble end of last week with high residuals. TF held and reglan started. Noted TF restarted by MD with goal rate of 60 ml/hr which provided 2160 kcals, 135 grams protein, 1094 ml H2O, which exceeds pt's nutrition needs.   Weight Status:  149 lbs (weight range 143-162 lbs)  Re-estimated needs:   1750 kcals  >100 grams protein  >2L/day fluid   Nutrition Dx:  Inadequate oral intake (NI-2.1). Status: Ongoing   Goal: Meet >90% estimated needs; not met    Intervention:   Once PEG placed and ready for use, recommend:  Start Pivot 1.5 at goal rate of 50 ml/hr, will provide 1800 kcals, 112 grams protein, 912 ml H2O.  Monitor:  TF tolerance, weight, transition to trach collar   Kendell Bane Cornelison Pager #:  [phone]

## 2011-12-31 NOTE — Plan of Care (Signed)
Problem: Inadequate Intake (NI-2.1) Goal: Food and/or nutrient delivery Individualized approach for food/nutrient provision.  Outcome: Progressing Pt having PEG placed.  TF Goal: Pivot 1.5 @ 50 ml/hr

## 2012-01-01 ENCOUNTER — Encounter (HOSPITAL_COMMUNITY): Payer: Self-pay | Admitting: General Surgery

## 2012-01-01 ENCOUNTER — Inpatient Hospital Stay (HOSPITAL_COMMUNITY): Payer: Medicare Other

## 2012-01-01 LAB — BASIC METABOLIC PANEL
Chloride: 100 mEq/L (ref 96–112)
Creatinine, Ser: 0.62 mg/dL (ref 0.50–1.35)
GFR calc Af Amer: >90 mL/min (ref >90)
Potassium: 4.1 mEq/L (ref 3.5–5.1)
Sodium: 136 mEq/L (ref 135–145)

## 2012-01-01 LAB — GLUCOSE, CAPILLARY
Glucose-Capillary: 125 mg/dL — ABNORMAL HIGH (ref 70–99)
Glucose-Capillary: 128 mg/dL — ABNORMAL HIGH (ref 70–99)
Glucose-Capillary: 98 mg/dL (ref 70–99)

## 2012-01-01 LAB — CBC
Platelets: 266 10*3/uL (ref 150–400)
RDW: 12.6 % (ref 11.5–15.5)
WBC: 11.5 10*3/uL — ABNORMAL HIGH (ref 4.0–10.5)

## 2012-01-01 MED ORDER — PIVOT 1.5 CAL PO LIQD
1000.0000 mL | ORAL | Status: DC
  Administered 2012-01-01 – 2012-01-05 (×5): 1000 mL
  Filled 2012-01-01 (×12): qty 1000

## 2012-01-01 MED ORDER — PIVOT 1.5 CAL PO LIQD: 1000.0000 mL | ORAL | Status: DC

## 2012-01-01 MED ORDER — CLONAZEPAM 1 MG PO TABS
2.0000 mg | ORAL_TABLET | Freq: Three times a day (TID) | ORAL | Status: DC
  Administered 2012-01-01 – 2012-01-03 (×6): 2 mg via ORAL
  Filled 2012-01-01 (×6): qty 2

## 2012-01-01 MED ORDER — METOCLOPRAMIDE HCL 5 MG/5ML PO SOLN
10.0000 mg | Freq: Four times a day (QID) | ORAL | Status: DC
  Administered 2012-01-01 – 2012-01-05 (×16): 10 mg via ORAL
  Filled 2012-01-01 (×21): qty 10

## 2012-01-01 MED ORDER — ACETAMINOPHEN 160 MG/5ML PO SOLN
650.0000 mg | ORAL | Status: DC | PRN
  Administered 2012-01-01 – 2012-01-02 (×4): 650 mg via ORAL
  Filled 2012-01-01 (×4): qty 20.3

## 2012-01-01 MED ORDER — FERROUS SULFATE 220 (44 FE) MG/5ML PO ELIX
220.0000 mg | ORAL_SOLUTION | Freq: Three times a day (TID) | ORAL | Status: DC
  Filled 2012-01-01 (×3): qty 5

## 2012-01-01 MED ORDER — FERROUS SULFATE 300 (60 FE) MG/5ML PO SYRP
300.0000 mg | ORAL_SOLUTION | Freq: Three times a day (TID) | ORAL | Status: DC
  Administered 2012-01-01 – 2012-01-07 (×18): 300 mg
  Filled 2012-01-01 (×21): qty 5

## 2012-01-01 MED ORDER — PIVOT 1.5 CAL PO LIQD
1000.0000 mL | ORAL | Status: DC
  Filled 2012-01-01 (×2): qty 1000

## 2012-01-01 MED ORDER — QUETIAPINE FUMARATE 100 MG PO TABS
100.0000 mg | ORAL_TABLET | Freq: Three times a day (TID) | ORAL | Status: DC
  Administered 2012-01-01 (×3): 100 mg
  Filled 2012-01-01 (×6): qty 1

## 2012-01-01 NOTE — Progress Notes (Signed)
Clinical Social Worker was able to obtain patient Medicare number through financial counseling.  CSW notified CM who will pursue possible LTAC placement.  Clinical Social Worker will follow up with patient and patient family to offer support and facilitate discharge needs ass needed.  [place], [place] [phone] '
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
## 2012-01-01 NOTE — Progress Notes (Signed)
The patient had a significant air leak from his trach while on the ventilator, but doing okay on trach collar this AM.  Agitated.  CPM  This patient has been seen and I agree with the findings and treatment plan.  Marta Lamas. Gae Bon, MD, FACS [phone] (pager) [phone] (direct pager) Trauma Surgeon

## 2012-01-02 ENCOUNTER — Inpatient Hospital Stay (HOSPITAL_COMMUNITY): Payer: Medicare Other

## 2012-01-02 LAB — GLUCOSE, CAPILLARY
Glucose-Capillary: 95 mg/dL (ref 70–99)
Glucose-Capillary: 184 mg/dL — ABNORMAL HIGH (ref 70–99)
Glucose-Capillary: 167 mg/dL — ABNORMAL HIGH (ref 70–99)

## 2012-01-02 LAB — CBC
MCV: 93.5 fL (ref 78.0–100.0)
Platelets: 278 10*3/uL (ref 150–400)
RDW: 12.7 % (ref 11.5–15.5)
WBC: 12.8 10*3/uL — ABNORMAL HIGH (ref 4.0–10.5)

## 2012-01-02 LAB — BASIC METABOLIC PANEL
Calcium: 8.2 mg/dL — ABNORMAL LOW (ref 8.4–10.5)
Chloride: 100 mEq/L (ref 96–112)
Creatinine, Ser: 0.71 mg/dL (ref 0.50–1.35)
GFR calc Af Amer: >90 mL/min (ref >90)
Sodium: 134 mEq/L — ABNORMAL LOW (ref 135–145)

## 2012-01-02 MED ORDER — FREE WATER
150.0000 mL | Freq: Four times a day (QID) | Status: DC
  Administered 2012-01-02 – 2012-01-07 (×19): 150 mL

## 2012-01-02 MED ORDER — PROPRANOLOL NICU ORAL SYRINGE 20 MG/5 ML
10.0000 mg | Freq: Two times a day (BID) | ORAL | Status: DC
  Administered 2012-01-02 – 2012-01-06 (×9): 10 mg via ORAL
  Filled 2012-01-02 (×10): qty 2.5

## 2012-01-02 MED ORDER — POTASSIUM CHLORIDE 20 MEQ/15ML (10%) PO LIQD
20.0000 meq | Freq: Three times a day (TID) | ORAL | Status: AC
  Administered 2012-01-02 – 2012-01-03 (×6): 20 meq
  Filled 2012-01-02 (×6): qty 15

## 2012-01-02 MED ORDER — QUETIAPINE FUMARATE 200 MG PO TABS
200.0000 mg | ORAL_TABLET | Freq: Three times a day (TID) | ORAL | Status: DC
  Administered 2012-01-02 (×3): 200 mg
  Filled 2012-01-02 (×7): qty 1
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
## 2012-01-02 NOTE — Progress Notes (Signed)
Advanced trach at bedside.  Still having leak of about 120cc per breath.  Sats are 100%.  Since it is so early, will not replace trach at this time - would likely do better with a long proximal.  Will place on trach collar again and see if he can stay on for 24 hours. Patient examined and I agree with the assessment and plan  Violeta Gelinas, MD, MPH, FACS Pager: [phone]  01/02/2012 8:31 AM
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
## 2012-01-02 NOTE — Progress Notes (Signed)
0200-Notified by RN of pt's increased WOB with nasal flaring, tachypnea, and tachycardia. Suctioned pt and increased FIO2 to 60% with moderate relief. Pt stable with HR around 102 and RR at 30. Pt did not appear to be struggling to breathe. 0250-Pt's HR 115 and RR-35 so pt placed back on vent to rest. There is still a significant leak around trach with cuff pressure reading 60cm H20. RT will continue to monitor.

## 2012-01-02 NOTE — Progress Notes (Signed)
Patient found with 02 sat 87% on 40% trach collar.  Patient increased to 100% trach collar with slight improvement of sats at 89-90.  Patient placed back on full ventilator support and suctioned from inline catheter.  Moderate amount of thick secretions retrieved (yellow and brown).  After suction 02 sats increased to 100%.  RT notified of this change.  Will continue to monitor and see if patient will be able to tolerate trach collar again this afternoon.

## 2012-01-03 ENCOUNTER — Inpatient Hospital Stay (HOSPITAL_COMMUNITY): Payer: Medicare Other

## 2012-01-03 ENCOUNTER — Encounter (HOSPITAL_COMMUNITY): Payer: Self-pay | Admitting: Radiology

## 2012-01-03 DIAGNOSIS — R069 Unspecified abnormalities of breathing: Secondary | ICD-10-CM

## 2012-01-03 LAB — BASIC METABOLIC PANEL
Chloride: 101 mEq/L (ref 96–112)
Creatinine, Ser: 0.6 mg/dL (ref 0.50–1.35)
GFR calc Af Amer: >90 mL/min (ref >90)
Potassium: 4.2 mEq/L (ref 3.5–5.1)
Sodium: 137 mEq/L (ref 135–145)

## 2012-01-03 LAB — CBC
Platelets: 312 10*3/uL (ref 150–400)
RDW: 12.5 % (ref 11.5–15.5)
WBC: 18 10*3/uL — ABNORMAL HIGH (ref 4.0–10.5)

## 2012-01-03 LAB — URINE CULTURE

## 2012-01-03 LAB — GLUCOSE, CAPILLARY
Glucose-Capillary: 181 mg/dL — ABNORMAL HIGH (ref 70–99)
Glucose-Capillary: 156 mg/dL — ABNORMAL HIGH (ref 70–99)
Glucose-Capillary: 157 mg/dL — ABNORMAL HIGH (ref 70–99)
Glucose-Capillary: 193 mg/dL — ABNORMAL HIGH (ref 70–99)

## 2012-01-03 MED ORDER — QUETIAPINE FUMARATE 50 MG PO TABS
150.0000 mg | ORAL_TABLET | Freq: Three times a day (TID) | ORAL | Status: DC
  Administered 2012-01-03 – 2012-01-06 (×12): 150 mg
  Filled 2012-01-03 (×15): qty 1

## 2012-01-03 MED ORDER — CLONAZEPAM 1 MG PO TABS
1.5000 mg | ORAL_TABLET | Freq: Three times a day (TID) | ORAL | Status: DC
  Administered 2012-01-03 – 2012-01-07 (×12): 1.5 mg via ORAL
  Filled 2012-01-03 (×4): qty 1
  Filled 2012-01-03: qty 2
  Filled 2012-01-03 (×7): qty 1

## 2012-01-03 MED ORDER — VANCOMYCIN HCL 1000 MG IV SOLR
750.0000 mg | Freq: Three times a day (TID) | INTRAVENOUS | Status: DC
  Administered 2012-01-03 – 2012-01-06 (×9): 750 mg via INTRAVENOUS
  Filled 2012-01-03 (×11): qty 750

## 2012-01-03 MED ORDER — DEXTROSE 5 % IV SOLN
1.0000 g | Freq: Two times a day (BID) | INTRAVENOUS | Status: DC
  Administered 2012-01-03 – 2012-01-05 (×5): 1 g via INTRAVENOUS
  Filled 2012-01-03 (×6): qty 1

## 2012-01-03 NOTE — Progress Notes (Signed)
Clinical Social Worker continuing to follow patient for emotional support and discharge planning needs.  No family at bedside.  Per MD, patient discharge plan remains for LTAC next week.  CSW will continue to be available for support and discharge planning needs.  [address] [place], [place] [phone]

## 2012-01-03 NOTE — Progress Notes (Signed)
Speech Pathology: TBI Coma Recovery Treatment Note  Subjective:  Eyes closed, laying in bed upon arrival  Objective: Treatment focused on coma recovery via facilitation of increased responsiveness.  SLP/PT elicited more generalized responses via physically assist supine to sit at EOB.  Baseline VS: RR 25; HR 100; Spo2@99 %. No significant change in VS with a decrease in RR to 20 bpm.  Patient showed more eye opening, triceps contraction both in non-purposeful ways (pulling arms away randomly) and purposeful (pulling arms away when SLP washed hands 100% of the time as well as pulling away from painful stimuli). Patient also showed generalized responses during sitting with weight shifting and lingual movements. Oral care provided with extraction of a broken piece of a denture or partial.  Pupils fixed, pinpoint and observed 2 occasions of eye tracking of SLP.   Assessment:  Demonstrating behaviors consistent with a Rancho II (generalized response) with 2 localized responses in a 30 minute session.  No sedation in place during today's session.  Recommendations:  1. Continue with TBI goals and POC  Pain:   unable to state; no signs Intervention Required:   No  Goals: Progressing  Dakota Stewart, M.S.,CCC-SLP Pager [phone]

## 2012-01-03 NOTE — Progress Notes (Signed)
Patient ID: Dakota Stewart, male   DOB: 1946/04/08, 66 y.o.   MRN: [id number] Ct head unchanged. Minimal ventricular blood,small subdural hygromas. Continue as per trauma and ent

## 2012-01-03 NOTE — Progress Notes (Signed)
I have seen and examined the patient and agree with the assessment and plans.  Breyonna Nault A. Dona Klemann  MD, FACS  
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
## 2012-01-03 NOTE — Progress Notes (Signed)
VASCULAR LAB PRELIMINARY  PRELIMINARY  PRELIMINARY  PRELIMINARY  Bilateral lower extremity venous duplex  completed.    Preliminary report:  Bilateral:  No evidence of DVT, superficial thrombosis, or Baker's Cyst.  Terance Hart, RVT 01/03/2012, 2:54 PM
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
## 2012-01-03 NOTE — Progress Notes (Signed)
Physical Therapy Treatment Patient Details Name: Dakota Stewart MRN: [id number] DOB: 08/29/1946 Today's Date: 01/03/2012  PT Assessment/Plan  PT - Assessment/Plan Comments on Treatment Session: Patient with signs of cognitive recovery as evidenced by consistent generalized responses and occasions of localization. ? Related to no fentanyl pre-session today. PT Plan: Discharge plan remains appropriate Follow Up Recommendations: LTACH Equipment Recommended: Defer to next venue PT Goals  Acute Rehab PT Goals PT Goal: Sit at Edge Of Bed - Progress: Progressing toward goal  PT Treatment Precautions/Restrictions  Precautions Precautions: Fall Precaution Comments: Wrist restraints bilaterally Required Braces or Orthoses: Yes Cervical Brace:  (Re-adjusted collar at EOB as impinging on trach and twisted) Restrictions Weight Bearing Restrictions: No Mobility (including Balance) Bed Mobility Supine to Sit: 1: +2 Total assist;Patient percentage (comment) Supine to Sit Details (indicate cue type and reason): 0% - for transition to edge of bed Sitting - Scoot to Edge of Bed: 1: +2 Total assist;Patient percentage (comment) Sitting - Scoot to Edge of Bed Details (indicate cue type and reason): 0% - for transition to edge of bed Sit to Supine: 1: +2 Total assist;Patient percentage (comment);HOB flat Sit to Supine - Details (indicate cue type and reason): 0%  Static Sitting Balance Static Sitting - Balance Support: Feet supported Static Sitting - Level of Assistance: 1: +1 Total assist Static Sitting - Comment/# of Minutes: Approx 15 minutes. Provided stimuli at edge of bed and attempted functional obeject use. Patient with generalized response to auditory stimuli - increased heart rate and an episode of eye opening. Localizes to painful stimuli - with adjustments in alignment and posture. Brief visual fixation on stationary target only. Occasional retraction and extension of shoulders away   from stimulus (localized), and occassionally without. No thoracic extension with this movement it was isolated to shoulder and shoulder girdle. With assist to wash face patient pulls arm back by side, and also on one occasion pushed trunk to left while sitting. End of Session PT - End of Session Activity Tolerance: Patient tolerated treatment well Patient left: in bed;with restraints reapplied General Behavior During Session: Lethargic Rancho Level of Function: Generalized response, total assistance (with emerging localized responses(III))  Edwyna Perfect, PT  Pager [phone] 01/03/2012, 11:22 AM

## 2012-01-04 ENCOUNTER — Inpatient Hospital Stay (HOSPITAL_COMMUNITY): Payer: Medicare Other

## 2012-01-04 LAB — CBC
Hemoglobin: 8 g/dL — ABNORMAL LOW (ref 13.0–17.0)
MCH: 31.9 pg (ref 26.0–34.0)
MCHC: 34.6 g/dL (ref 30.0–36.0)
MCV: 92 fL (ref 78.0–100.0)
RBC: 2.51 MIL/uL — ABNORMAL LOW (ref 4.22–5.81)

## 2012-01-04 LAB — COMPREHENSIVE METABOLIC PANEL
ALT: 51 U/L (ref 0–53)
BUN: 14 mg/dL (ref 6–23)
CO2: 26 mEq/L (ref 19–32)
Calcium: 8.9 mg/dL (ref 8.4–10.5)
Creatinine, Ser: 0.61 mg/dL (ref 0.50–1.35)
GFR calc Af Amer: >90 mL/min (ref >90)
GFR calc non Af Amer: >90 mL/min (ref >90)
Glucose, Bld: 210 mg/dL — ABNORMAL HIGH (ref 70–99)
Total Protein: 6.6 g/dL (ref 6.0–8.3)

## 2012-01-04 LAB — GLUCOSE, CAPILLARY
Glucose-Capillary: 143 mg/dL — ABNORMAL HIGH (ref 70–99)
Glucose-Capillary: 152 mg/dL — ABNORMAL HIGH (ref 70–99)

## 2012-01-04 MED ORDER — ALBUTEROL SULFATE (5 MG/ML) 0.5% IN NEBU
2.5000 mg | INHALATION_SOLUTION | Freq: Four times a day (QID) | RESPIRATORY_TRACT | Status: DC
  Administered 2012-01-04 – 2012-01-07 (×11): 2.5 mg via RESPIRATORY_TRACT
  Filled 2012-01-04 (×11): qty 0.5

## 2012-01-04 MED ORDER — IPRATROPIUM BROMIDE 0.02 % IN SOLN
0.5000 mg | Freq: Four times a day (QID) | RESPIRATORY_TRACT | Status: DC
  Administered 2012-01-04 – 2012-01-07 (×11): 0.5 mg via RESPIRATORY_TRACT
  Filled 2012-01-04 (×11): qty 2.5

## 2012-01-04 MED ORDER — ALTEPLASE 100 MG IV SOLR
2.0000 mg | Freq: Once | INTRAVENOUS | Status: AC
  Administered 2012-01-04: 2 mg
  Filled 2012-01-04: qty 2
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
## 2012-01-05 LAB — GLUCOSE, CAPILLARY
Glucose-Capillary: 160 mg/dL — ABNORMAL HIGH (ref 70–99)
Glucose-Capillary: 163 mg/dL — ABNORMAL HIGH (ref 70–99)
Glucose-Capillary: 183 mg/dL — ABNORMAL HIGH (ref 70–99)
Glucose-Capillary: 195 mg/dL — ABNORMAL HIGH (ref 70–99)

## 2012-01-05 LAB — CULTURE, RESPIRATORY W GRAM STAIN

## 2012-01-05 MED ORDER — DEXTROSE 5 % IV SOLN
2.0000 g | Freq: Two times a day (BID) | INTRAVENOUS | Status: DC
  Administered 2012-01-05: 2 g via INTRAVENOUS
  Filled 2012-01-05 (×3): qty 2

## 2012-01-05 NOTE — Progress Notes (Signed)
Patient ID: Dakota Stewart, male   DOB: June 15, 1946, 66 y.o.   MRN: [id number] Subjective: Patient reports Unresponsive  Objective: Vital signs in last 24 hours: Temp:  [99.3 F (37.4 C)-99.7 F (37.6 C)] 99.7 F (37.6 C) (01/27 0426) Pulse Rate:  [80-101] 88  (01/27 0700) Resp:  [20-29] 27  (01/27 0700) BP: (109-167)/(75-98) 143/91 mmHg (01/27 0700) SpO2:  [97 %-100 %] 100 % (01/27 0837) FiO2 (%):  [28 %] 28 % (01/27 0400) Weight:  [62.4 kg (137 lb 9.1 oz)] 62.4 kg (137 lb 9.1 oz) (01/27 0500)  Intake/Output from previous day: 01/26 0701 - 01/27 0700 In: 2450 [I.V.:720; NG/GT:60; IV Piggyback:350] Out: 2052 [Urine:2050; Stool:2] Intake/Output this shift:    May be lifting eyebrows to name as if he is trying to respond. Otherwise no change  Lab Results:  Basename 01/04/12 0400 01/03/12 0401  WBC 14.8* 18.0*  HGB 8.0* 8.3*  HCT 23.1* 23.5*  PLT 324 312   BMET  Basename 01/04/12 0400 01/03/12 0401  NA 134* 137  K 4.2 4.2  CL 101 101  CO2 26 29  GLUCOSE 210* 162*  BUN 14 13  CREATININE 0.61 0.60  CALCIUM 8.9 8.9    Studies/Results: Dg Chest Port 1 View  01/04/2012  *RADIOLOGY REPORT*  Clinical Data: Evaluate for pulmonary infiltrates  PORTABLE CHEST - 1 VIEW  Comparison: 01/03/2012; 01/02/2012; 01/01/2012  Findings: Grossly unchanged cardiac silhouette and mediastinal contours.  Stable position of support apparatus.  Grossly unchanged right perihilar and left basilar heterogeneous opacities.  No new focal airspace opacities.  There is persistent blunting of the left costophrenic angle, suggestive of a small pleural effusion. No definite right-sided pleural effusion.  No pneumothorax.  Grossly unchanged bones.  IMPRESSION: 1.  Stable position of support apparatus.  No pneumothorax. 2.  Grossly unchanged right perihilar and left basilar heterogeneous opacities, atelectasis versus infiltrate. 3. Possible small left-sided pleural effusion.  Original Report  Authenticated By: Waynard Reeds, M.D.    Assessment/Plan: Stable  LOS: 15 days  As above   Reinaldo Meeker, MD 01/05/2012, 9:01 AM

## 2012-01-05 NOTE — Progress Notes (Signed)
5 Days Post-Op  Subjective: Remains hemodynamically stable Has followed a few commands intermittently for nurses  Objective: Vital signs in last 24 hours: Temp:  [98.9 F (37.2 C)-99.7 F (37.6 C)] 98.9 F (37.2 C) (01/27 0800) Pulse Rate:  [80-101] 83  (01/27 0900) Resp:  [20-29] 20  (01/27 0900) BP: (109-167)/(75-98) 144/93 mmHg (01/27 0900) SpO2:  [97 %-100 %] 100 % (01/27 0900) FiO2 (%):  [28 %] 28 % (01/27 0400) Weight:  [137 lb 9.1 oz (62.4 kg)] 137 lb 9.1 oz (62.4 kg) (01/27 0500) Last BM Date: 01/05/12  Intake/Output from previous day: 01/26 0701 - 01/27 0700 In: 2450 [I.V.:720; NG/GT:60; IV Piggyback:350] Out: 2052 [Urine:2050; Stool:2] Intake/Output this shift: Total I/O In: 190 [I.V.:80; Other:110] Out: -   General appearance: no distress Trach stable Abdomen soft Lab Results:   Basename 01/04/12 0400 01/03/12 0401  WBC 14.8* 18.0*  HGB 8.0* 8.3*  HCT 23.1* 23.5*  PLT 324 312   BMET  Basename 01/04/12 0400 01/03/12 0401  NA 134* 137  K 4.2 4.2  CL 101 101  CO2 26 29  GLUCOSE 210* 162*  BUN 14 13  CREATININE 0.61 0.60  CALCIUM 8.9 8.9   PT/INR No results found for this basename: LABPROT:2,INR:2 in the last 72 hours ABG No results found for this basename: PHART:2,PCO2:2,PO2:2,HCO3:2 in the last 72 hours  Studies/Results: Dg Chest Port 1 View  01/04/2012  *RADIOLOGY REPORT*  Clinical Data: Evaluate for pulmonary infiltrates  PORTABLE CHEST - 1 VIEW  Comparison: 01/03/2012; 01/02/2012; 01/01/2012  Findings: Grossly unchanged cardiac silhouette and mediastinal contours.  Stable position of support apparatus.  Grossly unchanged right perihilar and left basilar heterogeneous opacities.  No new focal airspace opacities.  There is persistent blunting of the left costophrenic angle, suggestive of a small pleural effusion. No definite right-sided pleural effusion.  No pneumothorax.  Grossly unchanged bones.  IMPRESSION: 1.  Stable position of support  apparatus.  No pneumothorax. 2.  Grossly unchanged right perihilar and left basilar heterogeneous opacities, atelectasis versus infiltrate. 3. Possible small left-sided pleural effusion.  Original Report Authenticated By: Waynard Reeds, M.D.    Anti-infectives: Anti-infectives     Start     Dose/Rate Route Frequency Ordered Stop   01/03/12 1030   vancomycin (VANCOCIN) 750 mg in sodium chloride 0.9 % 150 mL IVPB        750 mg 150 mL/hr over 60 Minutes Intravenous Every 8 hours 01/03/12 0833     01/03/12 1000   ceFEPIme (MAXIPIME) 1 g in dextrose 5 % 50 mL IVPB        1 g 100 mL/hr over 30 Minutes Intravenous Every 12 hours 01/03/12 0819     12/26/11 1000   moxifloxacin (AVELOX) IVPB 400 mg  Status:  Discontinued        400 mg 250 mL/hr over 60 Minutes Intravenous Every 24 hours 12/26/11 0750 01/03/12 0819   12/22/11 1230   piperacillin-tazobactam (ZOSYN) IVPB 3.375 g  Status:  Discontinued        3.375 g 12.5 mL/hr over 240 Minutes Intravenous Every 8 hours 12/22/11 1155 12/26/11 0750          Assessment/Plan: s/p Procedure(s): PERCUTANEOUS TRACHEOSTOMY PERCUTANEOUS ENDOSCOPIC GASTROSTOMY (PEG) PLACEMENT continue current care D/C reglan  LOS: 15 days    Jaquelyn Sakamoto A 01/05/2012

## 2012-01-06 LAB — GLUCOSE, CAPILLARY: Glucose-Capillary: 162 mg/dL — ABNORMAL HIGH (ref 70–99)

## 2012-01-06 MED ORDER — CIPROFLOXACIN HCL 750 MG PO TABS
750.0000 mg | ORAL_TABLET | Freq: Two times a day (BID) | ORAL | Status: DC
  Administered 2012-01-06 – 2012-01-07 (×3): 750 mg
  Filled 2012-01-06 (×7): qty 1

## 2012-01-06 MED ORDER — PROPRANOLOL HCL 20 MG/5ML PO SOLN
10.0000 mg | Freq: Two times a day (BID) | ORAL | Status: DC
  Administered 2012-01-06 – 2012-01-07 (×2): 10 mg via ORAL
  Filled 2012-01-06 (×2): qty 2.5

## 2012-01-06 NOTE — Progress Notes (Signed)
Will see if LTACH is an option Patient examined and I agree with the assessment and plan  Violeta Gelinas, MD, MPH, FACS Pager: [phone]  01/06/2012 10:07 AM

## 2012-01-06 NOTE — Progress Notes (Signed)
Clinical Social Worker following for emotional support and discharge planning needs.  Per CM, patient family agreeable to LTAC placement at [facility].  [facility] reviewing patient information.    No SBIRT completed during patient hospital stay due to limited ability with patient mental status.  Clinical Social Worker will sign off for now as social work intervention is no longer needed. Please consult [place] again if new need arises.  [address] [place], [place] [phone]

## 2012-01-06 NOTE — Progress Notes (Signed)
Spoke with daughter Vikki Ports and offered choice to her for [facility]. She said that [facility] is more convenient for her and her sister to be able to visit with the patient and would prefer that he stay here in the building. Referral taken over to [facility] office and given to Admission coordinator, Burna Mortimer.

## 2012-01-06 NOTE — Progress Notes (Addendum)
Speech Pathology: TBI/Coma Recovery Treatment Note  Subjective:  Localizing to therapists entering room via head turn to the right. Eyes open.    Objective: VS: HR 98-102, RR 19-23, Spo2@99 -100% Treatment focused on facilitation of increase responsiveness for coma recovery.  Consistent localization responses with name calling, "open your eyes", "stand up", "lay down."  Patient following axial commands approximately 80% of the time.  No response to oral care attempts other than 1 yawn.  SLP provided maximal verbal, tactile, auditory, vestibular and contextual cues throughout the session to facilitate responsiveness. Tracheal secretions appears thinner today with expectoration at the level of the trach, requiring changing of gauze due to saturation.  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    Assessment: Demonstrates behaviors consistent with a Rancho III (localized response) and demonstrates likely ability to tolerate short increments of PMSV use with cuff deflation.  Recommendations:  1. Continue with POC via TBI team 2. Will consult with trauma services on order for cuff deflation and PMSV trials. 3. CIR consult as patient's cognitive recovery is more quickly progressing than first expected upon evaluation.  Not clear if/what family support there is for 24 hours care.  Patient may be in a Rancho IV within a few days.  Pain: unable to state yet facial grimacing with upper extremity mobilitly Intervention Required:   No  Goals: progressing  Myra Rude, M.S.,CCC-SLP Pager [phone]

## 2012-01-06 NOTE — Progress Notes (Signed)
Occupational Therapy Treatment Patient Details Name: Dakota Stewart MRN: [id number] DOB: 1946-07-01 Today's Date: 01/06/2012  OT Assessment/Plan OT Assessment/Plan OT Plan: Discharge plan remains appropriate OT Frequency: Min 2X/week Recommendations for Other Services: Other (comment) Follow Up Recommendations: LTACH Equipment Recommended: Defer to next venue OT Goals Acute Rehab OT Goals OT Goal Formulation: Patient unable to participate in goal setting Time For Goal Achievement: 2 weeks ADL Goals Pt Will Perform Grooming: with max assist;Supported;Sitting, edge of bed;with cueing (comment type and amount) ADL Goal: Grooming - Progress: Progressing toward goals Miscellaneous OT Goals Miscellaneous OT Goal #1: Pt demonstrate focused attention with EOB grooming task as precursor to ADLS and increased attention level OT Goal: Miscellaneous Goal #1 - Progress: Progressing toward goals Miscellaneous OT Goal #2: Pt will localize to 3 out 5 trials (thumbs up, stick out tongue, squeeze hand, two fingers extension, visually track, turn head to sound etc) demonstrating Rancho Coma Recovery level III   OT Goal: Miscellaneous Goal #2 - Progress: Progressing toward goals  OT Treatment Precautions/Restrictions  Precautions Precautions: Fall Precaution Comments: Wrist restraints bilaterally, peg, trach, PICC, cuff inflated Required Braces or Orthoses: Yes Cervical Brace: Hard collar Restrictions Weight Bearing Restrictions: No   ADL ADL Eating/Feeding: NPO Grooming: Performed;+1 Total assistance;Other (comment) Grooming Details (indicate cue type and reason): Pt provided cool wash cloth to face sitting eob to arouse pt  Pt wiped mouth with paper cloth s/p oral care with decreased lip closure on Lt side  (Pt holding wash cloth though not initiating. ) Where Assessed - Grooming: Sitting, bed;Supported Ambulation Related to ADLs: not performed  ADL Comments: Pt supine<>sit with total  +2 A. Pt restless in bed on arrival. Pt sitting eob with max A with trunk initiation. Pt with posterior pelvic tilt , hip flexion, scapula upward rotation, neck flexion. Pt with eyes open throughout session. pt with sustained attention demonstrated. pt following simple commands intermittently. Pt demonstrates Rancho level II (localized response). Pt pulling at mittens, pt attempting to doff mittens in bed. Pt initiated sit<>stand . Pt tolerated standing 1 minute 30 seconds. Pt provided cupping chest performed. Pt tolerated EOB ~15 minutes. Pt sit<>stand x2 . Pt sit<>supine pt initiating with BIL LE. Mobility  Bed Mobility Bed Mobility: Yes Rolling Left: 1: +2 Total assist;Patient percentage (comment) Rolling Left Details (indicate cue type and reason): 10% - does not intiate but will assist post half way. Left Sidelying to Sit: 1: +2 Total assist;Patient percentage (comment) Left Sidelying to Sit Details (indicate cue type and reason):   10%. Assists with end range of trunk transition  Sitting - Scoot to Edge of Bed: 1: +2 Total assist;Patient percentage (comment) Sitting - Scoot to Edge of Bed Details (indicate cue type and reason): 0% Sit to Supine: 1: +2 Total assist;Patient percentage (comment) Sit to Supine - Details (indicate cue type and reason): 30% - patient lifts both lower extremities on to bed. Requires assistance for transition of trunk. Transfers Transfers: Yes Sit to Stand: 1: +2 Total assist;Patient percentage (comment) Sit to Stand Details (indicate cue type and reason): 30% x 2. Patient initiating and assisting through range but diffiulty with end range extension of trunk and hips.  Stand to Sit: 1: +2 Total assist;Patient percentage (comment) Stand to Sit Details:   20% - needs assistance to control sit  Exercises    End of Session OT - End of Session Activity Tolerance: Treatment limited secondary to medical complications (Comment) Patient left: in bed;Other  (comment) General Behavior During Session:  Flat affect (restless) Cognition: Impaired Cognitive Impairment:   Patient with improved attention and consistency of responses today. Followed 2 axial one step commands.  Rancho Level of Function: Localized response, Total Assistance  Harrel Carina [facility]  01/06/2012, 9:56 AM Pager: [phone]

## 2012-01-06 NOTE — Plan of Care (Signed)
Problem: Phase III Progression Outcomes Goal: Ranchos Scale Level I - No Response Level II - Generalized Response Level III - Localized Response Level IV- Confused-Agitated Level V- Confused, Inappropriate, Non-Agitated Level VI- Confused-Appropriate Level VII- Automatic-Appropriate Level VIII- Purposeful-Appropriate  Outcome: Progressing Rancho Coma Recovery  Level III (localized response)

## 2012-01-06 NOTE — Progress Notes (Signed)
Physical Therapy Treatment Patient Details Name: Dakota Stewart MRN: [id number] DOB: 1946-03-14 Today's Date: 01/06/2012  PT Assessment/Plan  PT - Assessment/Plan Comments on Treatment Session: Excellent progress - patient consistently localizing to auditory stimuli, visual stimuli and pain (Rancho Level III). Patient may benefit from rehab consult. Recommendations for Other Services: Rehab consult Follow Up Recommendations: Inpatient Rehab Equipment Recommended: Defer to next venue PT Goals  Acute Rehab PT Goals PT Goal: Supine/Side to Sit - Progress: Progressing toward goal PT Goal: Sit at Edge Of Bed - Progress: Met PT Goal: Sit to Supine/Side - Progress: Met  PT Treatment Precautions/Restrictions  Precautions Precautions: Fall Precaution Comments: Wrist restraints bilaterally Required Braces or Orthoses: Yes Cervical Brace: Hard collar (at all times- readjusted at edge of bed. ) Restrictions Weight Bearing Restrictions: No Mobility (including Balance) Bed Mobility Rolling Left: 1: +2 Total assist;Patient percentage (comment) Rolling Left Details (indicate cue type and reason): 10% - does not intiate but will assist post half way. Left Sidelying to Sit: 1: +2 Total assist;Patient percentage (comment) Left Sidelying to Sit Details (indicate cue type and reason): 10%. Assists with end range of trunk transition Sitting - Scoot to Edge of Bed: 1: +2 Total assist;Patient percentage (comment) Sitting - Scoot to Edge of Bed Details (indicate cue type and reason): 0% Sit to Supine: 1: +2 Total assist;Patient percentage (comment) Sit to Supine - Details (indicate cue type and reason): 30% - patient lifts both lower extremities on to bed. Requires assistance for transition of trunk. Transfers Sit to Stand: 1: +2 Total assist;Patient percentage (comment) Sit to Stand Details (indicate cue type and reason): 30% x 2. Patient initiating and assisting through range but diffiulty  with end range extension of trunk and hips.  Stand to Sit: 1: +2 Total assist;Patient percentage (comment) Stand to Sit Details: 20% - needs assistance to control sit  Static Sitting Balance Static Sitting - Comment/# of Minutes: Sat for brief period at edge of bed unsupported - 1 minute. Also sat for ADL tasks with OT/SLP tasks for approx 15 mins total with max assist. Static Standing Balance Static Standing - Balance Support: Right upper extremity supported Static Standing - Level of Assistance: 1: +2 Total assist;Patient percentage (comment) Static Standing - Comment/# of Minutes: 20%. Needs support of left knee to prevent buckling and for upright posture otherwise trunk flexed. Worked on lateral weight translation to facilitate automatic stepping but needed total assist to move legs to step up toward head of bed. Stood x 2 first attempt x 90 secs second approx  60 End of Session PT - End of Session Equipment Utilized During Treatment: Gait belt Activity Tolerance: Patient tolerated treatment well Patient left: in bed;with restraints reapplied Nurse Communication: Mobility status for transfers General Behavior During Session:  (Moving in bed - pre session. Spontaneous lower extremity motion) Cognition: Impaired Cognitive Impairment: Patient with improved attention and consistency of responses today. Followed 2 axial one step commands. Rancho Level of Function: III - Localized response, Total Assistance  Edwyna Perfect, [place]  Pager [phone] 01/06/2012, 9:40 AM
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
## 2012-01-07 ENCOUNTER — Inpatient Hospital Stay
Admission: RE | Admit: 2012-01-07 | Discharge: 2012-02-05 | Disposition: A | Discharge status: Skilled Nursing Facility | Payer: Medicare Other | Source: Ambulatory Visit | Attending: Internal Medicine | Admitting: Internal Medicine

## 2012-01-07 LAB — BASIC METABOLIC PANEL
BUN: 17 mg/dL (ref 6–23)
Calcium: 8.7 mg/dL (ref 8.4–10.5)
GFR calc non Af Amer: >90 mL/min (ref >90)
Glucose, Bld: 137 mg/dL — ABNORMAL HIGH (ref 70–99)
Sodium: 129 mEq/L — ABNORMAL LOW (ref 135–145)

## 2012-01-07 LAB — CBC
HCT: 23.6 % — ABNORMAL LOW (ref 39.0–52.0)
Hemoglobin: 8.4 g/dL — ABNORMAL LOW (ref 13.0–17.0)
MCH: 31.6 pg (ref 26.0–34.0)
MCHC: 35.6 g/dL (ref 30.0–36.0)

## 2012-01-07 LAB — GLUCOSE, CAPILLARY: Glucose-Capillary: 142 mg/dL — ABNORMAL HIGH (ref 70–99)

## 2012-01-07 MED ORDER — IPRATROPIUM BROMIDE 0.02 % IN SOLN: 0.5000 mg | Freq: Four times a day (QID) | RESPIRATORY_TRACT | Status: DC

## 2012-01-07 MED ORDER — THIAMINE HCL 100 MG PO TABS: 100.0000 mg | ORAL_TABLET | Freq: Every day | ORAL | Status: DC

## 2012-01-07 MED ORDER — QUETIAPINE FUMARATE 100 MG PO TABS
100.0000 mg | ORAL_TABLET | Freq: Two times a day (BID) | ORAL | Status: DC
  Administered 2012-01-07: 100 mg
  Filled 2012-01-07 (×2): qty 1

## 2012-01-07 MED ORDER — CHLORHEXIDINE GLUCONATE 0.12 % MT SOLN: 15.0000 mL | Freq: Two times a day (BID) | OROMUCOSAL | Status: AC

## 2012-01-07 MED ORDER — ARTIFICIAL TEARS OP OINT: 1.0000 "application " | TOPICAL_OINTMENT | Freq: Three times a day (TID) | OPHTHALMIC | Status: DC

## 2012-01-07 MED ORDER — SODIUM CHLORIDE 0.9 % IJ SOLN: 10.0000 mL | Freq: Two times a day (BID) | INTRAMUSCULAR | Status: DC

## 2012-01-07 MED ORDER — PIVOT 1.5 CAL PO LIQD: 1000.0000 mL | ORAL | Status: DC

## 2012-01-07 MED ORDER — ALBUTEROL SULFATE (5 MG/ML) 0.5% IN NEBU: 2.5000 mg | INHALATION_SOLUTION | Freq: Four times a day (QID) | RESPIRATORY_TRACT | Status: DC

## 2012-01-07 MED ORDER — INSULIN GLARGINE 100 UNIT/ML ~~LOC~~ SOLN: 5.0000 [IU] | Freq: Two times a day (BID) | SUBCUTANEOUS | Status: DC

## 2012-01-07 MED ORDER — BISACODYL 10 MG RE SUPP: 10.0000 mg | Freq: Every day | RECTAL | Status: AC | PRN

## 2012-01-07 MED ORDER — BIOTENE DRY MOUTH MT LIQD: 15.0000 mL | Freq: Four times a day (QID) | OROMUCOSAL | Status: DC

## 2012-01-07 MED ORDER — FENTANYL BOLUS VIA INFUSION: 20.0000 ug | Freq: Four times a day (QID) | INTRAVENOUS | Status: DC | PRN

## 2012-01-07 MED ORDER — ACETAMINOPHEN 160 MG/5ML PO SOLN: 650.0000 mg | ORAL | Status: AC | PRN

## 2012-01-07 MED ORDER — SODIUM CHLORIDE 1 G PO TABS: 1.0000 g | ORAL_TABLET | Freq: Two times a day (BID) | ORAL | Status: DC

## 2012-01-07 MED ORDER — SODIUM CHLORIDE 0.9 % IJ SOLN: 10.0000 mL | INTRAMUSCULAR | Status: DC | PRN

## 2012-01-07 MED ORDER — CIPROFLOXACIN HCL 750 MG PO TABS: 750.0000 mg | ORAL_TABLET | Freq: Two times a day (BID) | ORAL | Status: AC

## 2012-01-07 MED ORDER — ONDANSETRON HCL 4 MG PO TABS: 4.0000 mg | ORAL_TABLET | Freq: Four times a day (QID) | ORAL | Status: AC | PRN

## 2012-01-07 MED ORDER — PANTOPRAZOLE SODIUM 40 MG PO PACK: 40.0000 mg | PACK | Freq: Every day | ORAL | Status: DC

## 2012-01-07 MED ORDER — INSULIN GLARGINE 100 UNIT/ML ~~LOC~~ SOLN
5.0000 [IU] | Freq: Two times a day (BID) | SUBCUTANEOUS | Status: DC
  Administered 2012-01-07: 5 [IU] via SUBCUTANEOUS
  Filled 2012-01-07 (×2): qty 3

## 2012-01-07 MED ORDER — QUETIAPINE FUMARATE 100 MG PO TABS: 100.0000 mg | ORAL_TABLET | Freq: Two times a day (BID) | ORAL | Status: DC

## 2012-01-07 MED ORDER — INSULIN ASPART 100 UNIT/ML ~~LOC~~ SOLN: 0.0000 [IU] | SUBCUTANEOUS | Status: DC

## 2012-01-07 MED ORDER — TOBRAMYCIN 0.3 % OP OINT: TOPICAL_OINTMENT | Freq: Two times a day (BID) | OPHTHALMIC | Status: AC

## 2012-01-07 MED ORDER — PROPRANOLOL HCL 20 MG/5ML PO SOLN: 10.0000 mg | Freq: Two times a day (BID) | ORAL | Status: DC

## 2012-01-07 MED ORDER — FERROUS SULFATE 300 (60 FE) MG/5ML PO SYRP: 300.0000 mg | ORAL_SOLUTION | Freq: Three times a day (TID) | ORAL | Status: DC

## 2012-01-07 MED ORDER — BACITRACIN-NEOMYCIN-POLYMYXIN OINTMENT TUBE: 1.0000 "application " | TOPICAL_OINTMENT | Freq: Three times a day (TID) | CUTANEOUS | Status: DC

## 2012-01-07 MED ORDER — CLONAZEPAM 1 MG PO TABS: 1.0000 mg | ORAL_TABLET | Freq: Three times a day (TID) | ORAL | Status: AC

## 2012-01-07 MED ORDER — CLONAZEPAM 1 MG PO TABS: 1.0000 mg | ORAL_TABLET | Freq: Three times a day (TID) | ORAL | Status: DC

## 2012-01-07 MED ORDER — BACITRACIN ZINC 500 UNIT/GM EX OINT: TOPICAL_OINTMENT | Freq: Two times a day (BID) | CUTANEOUS | Status: AC

## 2012-01-07 MED ORDER — FOLIC ACID 1 MG PO TABS: 1.0000 mg | ORAL_TABLET | Freq: Every day | ORAL | Status: DC

## 2012-01-07 MED ORDER — FREE WATER: 150.0000 mL | Freq: Four times a day (QID) | Status: DC

## 2012-01-07 MED ORDER — BISACODYL 10 MG RE SUPP: 10.0000 mg | Freq: Once | RECTAL | Status: AC

## 2012-01-07 NOTE — Progress Notes (Signed)
More alert and does follow some commands for me this AM Patient examined and I agree with the assessment and plan  Violeta Gelinas, MD, MPH, FACS Pager: [phone]  01/07/2012 8:52 AM

## 2012-01-07 NOTE — Progress Notes (Signed)
Patient ID: [name], male   DOB: 1946-01-25, 66 y.o.   MRN: [id number] Stable, extubated. F/c. Moves all 4 extremities. Ct head ,small hygromas. No need for ns f/u except as prn

## 2012-01-07 NOTE — Discharge Summary (Signed)
Mina Babula, MD, MPH, FACS Pager: [phone]  

## 2012-01-07 NOTE — Progress Notes (Signed)
   ENT Progress Note: POD #17 s/p Procedure(s): REPAIR MULTIPLE LACERATIONS  NASAL RECONSTRUCTION WITH SEPTAL REPAIR  OPEN REDUCTION INTERNAL FIXATION (ORIF) FACIAL FRACTURE    Subjective: Pt stable No sig neurologic improvement Stable airway on trach collar  Objective: Vital signs in last 24 hours: Temp:  [98.3 F (36.8 C)-99.4 F (37.4 C)] 99.4 F (37.4 C) (01/29 0749) Pulse Rate:  [74-95] 89  (01/29 0830) Resp:  [17-27] 27  (01/29 0830) BP: (100-154)/(62-97) 141/95 mmHg (01/29 0830) SpO2:  [97 %-100 %] 97 % (01/29 0830) FiO2 (%):  [28 %-29 %] 29 % (01/29 0830) Weight:  [62.5 kg (137 lb 12.6 oz)] 62.5 kg (137 lb 12.6 oz) (01/29 0500) Weight change: -0.9 kg (-1 lb 15.8 oz) Last BM Date: 01/06/12  Intake/Output from previous day: 01/28 0701 - 01/29 0700 In: 2360 [I.V.:480; NG/GT:560] Out: 3160 [Urine:3160] Intake/Output this shift:    Labs:  [facility] 01/07/12 0550  WBC 11.1*  HGB 8.4*  HCT 23.6*  PLT 397    Basename 01/07/12 0550  NA 129*  K 3.6  CL 94*  CO2 28  GLUCOSE 137*  BUN 17  CALCIUM 8.7    Studies/Results: No results found.   PHYSICAL EXAM: Decreased facial edema Nasal septal splints removed, mucosa intact and healing Lacerations healing, no evidence of infection Facial fractures intact and stable     Assessment/Plan: Facial injuries stable, ORIF intact, lacerations healed No active ENT/Facial problems Orbital injuries are stable - would not consider surgical intervention. Pt stable for transfer and long term care Please reconsult if concerns arise    Dakota Stewart 01/07/2012, 9:14 AM
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
## 2012-01-07 NOTE — Progress Notes (Signed)
Speech Language/Pathology  SLP was preparing to initiate a Passy-Muir Speaking assessment, however, pt.'s belongings are being packed up for [facility]. Will defer PMSV assessment to LTAC.  Breck Coons [place]Ed [employer] [phone]  01/07/2012
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
## 2012-01-08 LAB — COMPREHENSIVE METABOLIC PANEL
AST: 40 U/L — ABNORMAL HIGH (ref 0–37)
BUN: 16 mg/dL (ref 6–23)
CO2: 28 mEq/L (ref 19–32)
Calcium: 8.9 mg/dL (ref 8.4–10.5)
Chloride: 90 mEq/L — ABNORMAL LOW (ref 96–112)
Creatinine, Ser: 0.48 mg/dL — ABNORMAL LOW (ref 0.50–1.35)
GFR calc Af Amer: >90 mL/min (ref >90)
GFR calc non Af Amer: >90 mL/min (ref >90)
Total Bilirubin: 0.3 mg/dL (ref 0.3–1.2)

## 2012-01-08 LAB — CBC
HCT: 24.3 % — ABNORMAL LOW (ref 39.0–52.0)
MCH: 31.4 pg (ref 26.0–34.0)
MCV: 89.7 fL (ref 78.0–100.0)
Platelets: 459 10*3/uL — ABNORMAL HIGH (ref 150–400)
RDW: 12.8 % (ref 11.5–15.5)
WBC: 12.2 10*3/uL — ABNORMAL HIGH (ref 4.0–10.5)

## 2012-01-08 LAB — DIFFERENTIAL
Eosinophils Relative: 2 % (ref 0–5)
Lymphs Abs: 2.1 10*3/uL (ref 0.7–4.0)
Monocytes Absolute: 1.6 10*3/uL — ABNORMAL HIGH (ref 0.1–1.0)
Monocytes Relative: 13 % — ABNORMAL HIGH (ref 3–12)
Neutro Abs: 8.2 10*3/uL — ABNORMAL HIGH (ref 1.7–7.7)

## 2012-01-08 LAB — TSH: TSH: 21.099 u[IU]/mL — ABNORMAL HIGH (ref 0.350–4.500)

## 2012-01-08 LAB — FERRITIN: Ferritin: 383 ng/mL — ABNORMAL HIGH (ref 22–322)

## 2012-01-08 LAB — MAGNESIUM: Magnesium: 2.2 mg/dL (ref 1.5–2.5)

## 2012-01-09 LAB — BASIC METABOLIC PANEL
BUN: 19 mg/dL (ref 6–23)
CO2: 27 mEq/L (ref 19–32)
Calcium: 9 mg/dL (ref 8.4–10.5)
GFR calc non Af Amer: >90 mL/min (ref >90)
Glucose, Bld: 167 mg/dL — ABNORMAL HIGH (ref 70–99)

## 2012-01-09 LAB — CULTURE, BLOOD (ROUTINE X 2)
Culture  Setup Time: 201301251359
Culture: NO GROWTH

## 2012-01-10 ENCOUNTER — Other Ambulatory Visit (HOSPITAL_COMMUNITY): Payer: Self-pay

## 2012-01-10 LAB — BASIC METABOLIC PANEL
BUN: 19 mg/dL (ref 6–23)
CO2: 29 mEq/L (ref 19–32)
GFR calc non Af Amer: >90 mL/min (ref >90)
Glucose, Bld: 150 mg/dL — ABNORMAL HIGH (ref 70–99)
Potassium: 3.9 mEq/L (ref 3.5–5.1)
Sodium: 127 mEq/L — ABNORMAL LOW (ref 135–145)

## 2012-01-13 LAB — CBC
HCT: 25.1 % — ABNORMAL LOW (ref 39.0–52.0)
MCH: 31.1 pg (ref 26.0–34.0)
MCV: 86.9 fL (ref 78.0–100.0)
Platelets: 397 10*3/uL (ref 150–400)
RBC: 2.89 MIL/uL — ABNORMAL LOW (ref 4.22–5.81)
WBC: 12.2 10*3/uL — ABNORMAL HIGH (ref 4.0–10.5)

## 2012-01-13 LAB — BASIC METABOLIC PANEL
BUN: 17 mg/dL (ref 6–23)
CO2: 29 mEq/L (ref 19–32)
Calcium: 9.3 mg/dL (ref 8.4–10.5)
Chloride: 90 mEq/L — ABNORMAL LOW (ref 96–112)
Creatinine, Ser: 0.51 mg/dL (ref 0.50–1.35)
Glucose, Bld: 151 mg/dL — ABNORMAL HIGH (ref 70–99)

## 2012-01-14 ENCOUNTER — Other Ambulatory Visit (HOSPITAL_COMMUNITY): Payer: Self-pay

## 2012-01-14 LAB — BASIC METABOLIC PANEL
BUN: 18 mg/dL (ref 6–23)
CO2: 28 mEq/L (ref 19–32)
Chloride: 94 mEq/L — ABNORMAL LOW (ref 96–112)
Creatinine, Ser: 0.52 mg/dL (ref 0.50–1.35)
GFR calc Af Amer: >90 mL/min (ref >90)
Glucose, Bld: 176 mg/dL — ABNORMAL HIGH (ref 70–99)
Potassium: 3.6 mEq/L (ref 3.5–5.1)

## 2012-01-15 LAB — BASIC METABOLIC PANEL
BUN: 21 mg/dL (ref 6–23)
CO2: 28 mEq/L (ref 19–32)
Chloride: 96 mEq/L (ref 96–112)
GFR calc non Af Amer: >90 mL/min (ref >90)
Glucose, Bld: 182 mg/dL — ABNORMAL HIGH (ref 70–99)
Potassium: 4.6 mEq/L (ref 3.5–5.1)
Sodium: 130 mEq/L — ABNORMAL LOW (ref 135–145)

## 2012-01-15 LAB — PHOSPHORUS: Phosphorus: 3.2 mg/dL (ref 2.3–4.6)

## 2012-01-16 LAB — BASIC METABOLIC PANEL
CO2: 31 mEq/L (ref 19–32)
Calcium: 10.5 mg/dL (ref 8.4–10.5)
Chloride: 96 mEq/L (ref 96–112)
Potassium: 3.7 mEq/L (ref 3.5–5.1)
Sodium: 133 mEq/L — ABNORMAL LOW (ref 135–145)

## 2012-01-17 LAB — BASIC METABOLIC PANEL
CO2: 32 mEq/L (ref 19–32)
GFR calc non Af Amer: >90 mL/min (ref >90)
Glucose, Bld: 139 mg/dL — ABNORMAL HIGH (ref 70–99)
Potassium: 3.7 mEq/L (ref 3.5–5.1)
Sodium: 136 mEq/L (ref 135–145)

## 2012-01-17 LAB — CBC
Hemoglobin: 10.2 g/dL — ABNORMAL LOW (ref 13.0–17.0)
MCH: 31.3 pg (ref 26.0–34.0)
RBC: 3.26 MIL/uL — ABNORMAL LOW (ref 4.22–5.81)

## 2012-01-18 LAB — CBC
HCT: 30.2 % — ABNORMAL LOW (ref 39.0–52.0)
MCV: 89.3 fL (ref 78.0–100.0)
RDW: 13.6 % (ref 11.5–15.5)
WBC: 15.4 10*3/uL — ABNORMAL HIGH (ref 4.0–10.5)

## 2012-01-19 LAB — CBC
HCT: 29.6 % — ABNORMAL LOW (ref 39.0–52.0)
Hemoglobin: 10.3 g/dL — ABNORMAL LOW (ref 13.0–17.0)
MCH: 31.1 pg (ref 26.0–34.0)
MCHC: 34.8 g/dL (ref 30.0–36.0)
MCV: 89.4 fL (ref 78.0–100.0)

## 2012-01-19 LAB — BASIC METABOLIC PANEL
BUN: 33 mg/dL — ABNORMAL HIGH (ref 6–23)
Calcium: 10.2 mg/dL (ref 8.4–10.5)
Creatinine, Ser: 0.62 mg/dL (ref 0.50–1.35)
GFR calc non Af Amer: >90 mL/min (ref >90)
Glucose, Bld: 121 mg/dL — ABNORMAL HIGH (ref 70–99)
Sodium: 136 mEq/L (ref 135–145)

## 2012-01-21 DIAGNOSIS — F1010 Alcohol abuse, uncomplicated: Secondary | ICD-10-CM
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
## 2012-01-23 LAB — BASIC METABOLIC PANEL
Calcium: 10.3 mg/dL (ref 8.4–10.5)
Creatinine, Ser: 0.6 mg/dL (ref 0.50–1.35)
GFR calc non Af Amer: >90 mL/min (ref >90)
Glucose, Bld: 148 mg/dL — ABNORMAL HIGH (ref 70–99)
Sodium: 142 mEq/L (ref 135–145)

## 2012-01-23 LAB — CBC
MCH: 31.4 pg (ref 26.0–34.0)
MCV: 91.6 fL (ref 78.0–100.0)
Platelets: 282 10*3/uL (ref 150–400)
RDW: 13.8 % (ref 11.5–15.5)

## 2012-01-23 LAB — DIFFERENTIAL
Basophils Absolute: 0.1 10*3/uL (ref 0.0–0.1)
Eosinophils Absolute: 0.3 10*3/uL (ref 0.0–0.7)
Eosinophils Relative: 2 % (ref 0–5)
Monocytes Absolute: 0.9 10*3/uL (ref 0.1–1.0)

## 2012-01-23 LAB — MAGNESIUM: Magnesium: 2.3 mg/dL (ref 1.5–2.5)

## 2012-01-25 LAB — BASIC METABOLIC PANEL
Calcium: 9.1 mg/dL (ref 8.4–10.5)
Creatinine, Ser: 0.59 mg/dL (ref 0.50–1.35)
GFR calc non Af Amer: >90 mL/min (ref >90)
Glucose, Bld: 119 mg/dL — ABNORMAL HIGH (ref 70–99)
Sodium: 139 mEq/L (ref 135–145)

## 2012-01-27 ENCOUNTER — Other Ambulatory Visit (HOSPITAL_COMMUNITY): Payer: Self-pay

## 2012-01-27 LAB — URINE MICROSCOPIC-ADD ON

## 2012-01-27 LAB — URINALYSIS, ROUTINE W REFLEX MICROSCOPIC
Bilirubin Urine: NEGATIVE
Ketones, ur: NEGATIVE mg/dL
Nitrite: NEGATIVE
Protein, ur: NEGATIVE mg/dL
Urobilinogen, UA: 0.2 mg/dL (ref 0.0–1.0)
pH: 5.5 (ref 5.0–8.0)

## 2012-01-27 LAB — CBC
HCT: 31.8 % — ABNORMAL LOW (ref 39.0–52.0)
MCHC: 33 g/dL (ref 30.0–36.0)
MCV: 93.3 fL (ref 78.0–100.0)
Platelets: 202 10*3/uL (ref 150–400)
RDW: 14.4 % (ref 11.5–15.5)
WBC: 12.5 10*3/uL — ABNORMAL HIGH (ref 4.0–10.5)

## 2012-01-27 LAB — BASIC METABOLIC PANEL
BUN: 37 mg/dL — ABNORMAL HIGH (ref 6–23)
Calcium: 10.7 mg/dL — ABNORMAL HIGH (ref 8.4–10.5)
Chloride: 109 mEq/L (ref 96–112)
Creatinine, Ser: 0.69 mg/dL (ref 0.50–1.35)
GFR calc Af Amer: >90 mL/min (ref >90)
GFR calc non Af Amer: >90 mL/min (ref >90)

## 2012-01-27 MED ORDER — IOHEXOL 300 MG/ML  SOLN
50.0000 mL | Freq: Once | INTRAMUSCULAR | Status: AC | PRN
  Administered 2012-01-27: 10 mL via INTRAVENOUS

## 2012-01-28 LAB — COMPREHENSIVE METABOLIC PANEL
ALT: 22 U/L (ref 0–53)
AST: 19 U/L (ref 0–37)
Alkaline Phosphatase: 117 U/L (ref 39–117)
CO2: 34 mEq/L — ABNORMAL HIGH (ref 19–32)
Calcium: 10.5 mg/dL (ref 8.4–10.5)
GFR calc Af Amer: >90 mL/min (ref >90)
GFR calc non Af Amer: >90 mL/min (ref >90)
Glucose, Bld: 129 mg/dL — ABNORMAL HIGH (ref 70–99)
Potassium: 3.5 mEq/L (ref 3.5–5.1)
Sodium: 152 mEq/L — ABNORMAL HIGH (ref 135–145)

## 2012-01-28 LAB — URINE CULTURE: Culture  Setup Time: 201302182259

## 2012-01-30 LAB — BASIC METABOLIC PANEL
CO2: 32 mEq/L (ref 19–32)
Calcium: 10.2 mg/dL (ref 8.4–10.5)
Chloride: 115 mEq/L — ABNORMAL HIGH (ref 96–112)
Creatinine, Ser: 0.72 mg/dL (ref 0.50–1.35)
Glucose, Bld: 155 mg/dL — ABNORMAL HIGH (ref 70–99)
Sodium: 155 mEq/L — ABNORMAL HIGH (ref 135–145)

## 2012-01-30 LAB — CBC
HCT: 32.2 % — ABNORMAL LOW (ref 39.0–52.0)
MCH: 30.3 pg (ref 26.0–34.0)
MCV: 93.9 fL (ref 78.0–100.0)
RBC: 3.43 MIL/uL — ABNORMAL LOW (ref 4.22–5.81)
WBC: 10.8 10*3/uL — ABNORMAL HIGH (ref 4.0–10.5)

## 2012-01-30 LAB — DIFFERENTIAL
Eosinophils Absolute: 0.4 10*3/uL (ref 0.0–0.7)
Eosinophils Relative: 3 % (ref 0–5)
Lymphocytes Relative: 6 % — ABNORMAL LOW (ref 12–46)
Lymphs Abs: 0.7 10*3/uL (ref 0.7–4.0)
Monocytes Absolute: 0.1 10*3/uL (ref 0.1–1.0)

## 2012-01-31 LAB — BASIC METABOLIC PANEL
BUN: 34 mg/dL — ABNORMAL HIGH (ref 6–23)
CO2: 30 mEq/L (ref 19–32)
Calcium: 9.5 mg/dL (ref 8.4–10.5)
Creatinine, Ser: 0.64 mg/dL (ref 0.50–1.35)
GFR calc non Af Amer: >90 mL/min (ref >90)
Glucose, Bld: 172 mg/dL — ABNORMAL HIGH (ref 70–99)

## 2012-02-01 LAB — BASIC METABOLIC PANEL
Calcium: 8.9 mg/dL (ref 8.4–10.5)
GFR calc non Af Amer: >90 mL/min (ref >90)
Glucose, Bld: 165 mg/dL — ABNORMAL HIGH (ref 70–99)
Sodium: 137 mEq/L (ref 135–145)

## 2012-02-02 LAB — BASIC METABOLIC PANEL
BUN: 24 mg/dL — ABNORMAL HIGH (ref 6–23)
GFR calc Af Amer: >90 mL/min (ref >90)
GFR calc non Af Amer: >90 mL/min (ref >90)
Potassium: 4.9 mEq/L (ref 3.5–5.1)
Sodium: 135 mEq/L (ref 135–145)

## 2012-02-03 LAB — PREALBUMIN: Prealbumin: 17 mg/dL — ABNORMAL LOW (ref 17.0–34.0)

## 2012-02-03 LAB — BASIC METABOLIC PANEL
BUN: 27 mg/dL — ABNORMAL HIGH (ref 6–23)
CO2: 27 mEq/L (ref 19–32)
Calcium: 9.2 mg/dL (ref 8.4–10.5)
Creatinine, Ser: 0.56 mg/dL (ref 0.50–1.35)

## 2012-02-03 LAB — CBC
MCH: 30.5 pg (ref 26.0–34.0)
MCHC: 34.2 g/dL (ref 30.0–36.0)
MCV: 89.3 fL (ref 78.0–100.0)
Platelets: 160 10*3/uL (ref 150–400)
RDW: 13.8 % (ref 11.5–15.5)
WBC: 10.8 10*3/uL — ABNORMAL HIGH (ref 4.0–10.5)

## 2012-02-04 LAB — CBC
MCHC: 33.4 g/dL (ref 30.0–36.0)
Platelets: 179 10*3/uL (ref 150–400)
RDW: 13.8 % (ref 11.5–15.5)

## 2012-02-04 LAB — DIFFERENTIAL
Basophils Absolute: 0 10*3/uL (ref 0.0–0.1)
Basophils Relative: 0 % (ref 0–1)
Eosinophils Absolute: 0.5 10*3/uL (ref 0.0–0.7)
Neutro Abs: 3.8 10*3/uL (ref 1.7–7.7)
Neutrophils Relative %: 52 % (ref 43–77)

## 2012-02-05 LAB — COMPREHENSIVE METABOLIC PANEL
ALT: 34 U/L (ref 0–53)
Albumin: 2.5 g/dL — ABNORMAL LOW (ref 3.5–5.2)
Alkaline Phosphatase: 137 U/L — ABNORMAL HIGH (ref 39–117)
Calcium: 9.5 mg/dL (ref 8.4–10.5)
Potassium: 4.1 mEq/L (ref 3.5–5.1)
Sodium: 142 mEq/L (ref 135–145)
Total Protein: 7.5 g/dL (ref 6.0–8.3)

## 2012-04-08 HISTORY — PX: PEG TUBE REMOVAL: SHX2187

## 2012-04-17 ENCOUNTER — Telehealth: Payer: Self-pay | Admitting: Orthopedic Surgery

## 2012-04-17 NOTE — Telephone Encounter (Signed)
Needed appt to have g-tube removed

## 2012-04-23 ENCOUNTER — Encounter (INDEPENDENT_AMBULATORY_CARE_PROVIDER_SITE_OTHER): Payer: Self-pay

## 2012-04-23 ENCOUNTER — Ambulatory Visit (INDEPENDENT_AMBULATORY_CARE_PROVIDER_SITE_OTHER): Payer: Medicare Other | Admitting: Orthopedic Surgery

## 2012-04-23 VITALS — BP 122/84 | HR 76 | Temp 97.8°F | Resp 18 | Ht 67.0 in | Wt 149.4 lb

## 2012-04-23 DIAGNOSIS — Z4659 Encounter for fitting and adjustment of other gastrointestinal appliance and device: Secondary | ICD-10-CM

## 2012-04-23 NOTE — Patient Instructions (Signed)
See written sheet

## 2012-04-23 NOTE — Progress Notes (Signed)
Subjective Patient is referred over from SNF for g-tube removal. Has not been fed through it in months. Eating and eliminating without difficulty.   Objective G-tube removed without difficulty. Mild annular granulation tissue. Covered with occlusive dressing.   Assessment & Plan S/p PEG tube removal -- Care instructions provided to SNF. F/u prn.

## 2012-05-06 ENCOUNTER — Emergency Department (HOSPITAL_COMMUNITY)
Admission: EM | Admit: 2012-05-06 | Discharge: 2012-05-07 | Disposition: A | Discharge status: Home / Self Care | Payer: Medicare Other | Attending: Emergency Medicine | Admitting: Emergency Medicine

## 2012-05-06 ENCOUNTER — Encounter (HOSPITAL_COMMUNITY): Payer: Self-pay | Admitting: Emergency Medicine

## 2012-05-06 DIAGNOSIS — F418 Other specified anxiety disorders: Secondary | ICD-10-CM

## 2012-05-06 DIAGNOSIS — F411 Generalized anxiety disorder: Secondary | ICD-10-CM | POA: Insufficient documentation

## 2012-05-06 DIAGNOSIS — Z794 Long term (current) use of insulin: Secondary | ICD-10-CM | POA: Insufficient documentation

## 2012-05-06 DIAGNOSIS — F17200 Nicotine dependence, unspecified, uncomplicated: Secondary | ICD-10-CM | POA: Insufficient documentation

## 2012-05-06 NOTE — ED Notes (Signed)
Patient is refusing to have vital signs taken and says he just wants something to eat.

## 2012-05-06 NOTE — ED Provider Notes (Signed)
History     CSN: [id number]  Arrival date & time 05/06/12  2015   First MD Initiated Contact with Patient 05/06/12 2310      Chief Complaint  Patient presents with  . Medical Clearance    (Consider location/radiation/quality/duration/timing/severity/associated sxs/prior treatment) The history is provided by the patient.   the patient the living Center this evening and states he was hungry and trying to find a [employer].  He was unable to burger came reports he was on the wrong side of town was uncomfortable in his current situation and thus a Emergency planning/management officer was flagged down who brought him to the emergency department for medical screening.  The patient reports she hurts nowhere.  He only reports that he is hungry and wants something the.  As reported by police that based on the living Center where the patient is saying that he needs to be screened before he can return.  Patient denies chest pain shortness breath.  He has no abdominal pain.  He denies nausea vomiting or diarrhea.  He is alert and oriented x3  Past Medical History  Diagnosis Date  . Difficult intubation     Past Surgical History  Procedure Date  . Laceration repair 12/21/2011    Procedure: REPAIR MULTIPLE LACERATIONS;  Surgeon: Barbee Cough;  Location: [facility] OR;  Service: ENT;  Laterality: N/A;  . Orif facial fracture 12/21/2011    Procedure: OPEN REDUCTION INTERNAL FIXATION (ORIF) FACIAL FRACTURE;  Surgeon: Barbee Cough;  Location: [facility] OR;  Service: ENT;  Laterality: Bilateral;  . Percutaneous tracheostomy 12/31/2011    Procedure: PERCUTANEOUS TRACHEOSTOMY;  Surgeon: Liz Malady, MD;  Location: [facility] OR;  Service: General;  Laterality: N/A;  . Peg placement 12/31/2011    Procedure: PERCUTANEOUS ENDOSCOPIC GASTROSTOMY (PEG) PLACEMENT;  Surgeon: Liz Malady, MD;  Location: [facility] OR;  Service: General;  Laterality: N/A;    History reviewed. No pertinent family history.  History  Substance Use Topics  .  Smoking status: Current Everyday Smoker -- 0.2 packs/day for 40 years    Types: Cigarettes  . Smokeless tobacco: Never Used  . Alcohol Use: 5.0 oz/week    10 drink(s) per week      Review of Systems  All other systems reviewed and are negative.    Allergies  Review of patient's allergies indicates no known allergies.  Home Medications   Current Outpatient Rx  Name Route Sig Dispense Refill  . ACETAMINOPHEN 500 MG PO TABS Oral Take 500 mg by mouth 3 (three) times daily.    . ATENOLOL 25 MG PO TABS Oral Take 12.5 mg by mouth 2 (two) times daily.    Marland Kitchen DIVALPROEX SODIUM 250 MG PO TBEC Oral Take 250 mg by mouth 2 (two) times daily.    . DONEPEZIL HCL 5 MG PO TBDP Oral Take 5 mg by mouth at bedtime.    Marland Kitchen FOLIC ACID 1 MG PO TABS Oral Take 1 mg by mouth daily.    . IBUPROFEN 200 MG PO TABS Oral Take 200 mg by mouth every 8 (eight) hours as needed. Pain    . INSULIN ASPART 100 UNIT/ML [place] SOLN Subcutaneous Inject 0-9 Units into the skin 2 (two) times daily. 250>=3 units 350>=5 units 450>=10 units    . LEVOTHYROXINE SODIUM 112 MCG PO TABS Oral Take 112 mcg by mouth daily.    Marland Kitchen PANTOPRAZOLE SODIUM 40 MG PO TBEC Oral Take 40 mg by mouth daily.    . QUETIAPINE FUMARATE  100 MG PO TABS Oral Take 100 mg by mouth at bedtime.    Marland Kitchen QUETIAPINE FUMARATE 200 MG PO TABS Oral Take 200 mg by mouth at bedtime.    . SERTRALINE HCL 50 MG PO TABS Oral Take 50 mg by mouth daily.    . THIAMINE HCL 100 MG PO TABS Oral Take 1 tablet (100 mg total) by mouth daily.    . QUETIAPINE FUMARATE 100 MG PO TABS Per Tube Place 1 tablet (100 mg total) into feeding tube 2 (two) times daily.      BP 147/101  Pulse 82  Temp(Src) 98.3 F (36.8 C) (Oral)  Resp 20  SpO2 100%  Physical Exam  Nursing note and vitals reviewed. Constitutional: He is oriented to person, place, and time. He appears well-developed and well-nourished.  HENT:  Head: Normocephalic and atraumatic.  Eyes: EOM are normal.  Neck: Normal range of  motion.  Cardiovascular: Normal rate, regular rhythm, normal heart sounds and intact distal pulses.   Pulmonary/Chest: Effort normal and breath sounds normal. No respiratory distress.  Abdominal: Soft. He exhibits no distension. There is no tenderness.  Musculoskeletal: Normal range of motion.  Neurological: He is alert and oriented to person, place, and time.  Skin: Skin is warm and dry.  Psychiatric: He has a normal mood and affect. Judgment normal.    ED Course  Procedures (including critical care time)  Labs Reviewed - No data to display No results found.   1. Situational anxiety       MDM  Medical screening examination completed.  Patient is alert and oriented x3.  He has no evidence of psychiatric instability.  He has no medical complaints.  His vital signs are normal.  Discharge home        Lyanne Co, MD 05/07/12 0000

## 2012-05-06 NOTE — ED Notes (Signed)
Pt states there is nothing wrong with him tonight  Pt states he was talking with an Horticulturist, commercial and told him he was hungry and the officer told him he would bring him here for food and shelter  Police state pt stays at [facility] and pt wandered off and would not come back so they sent him here for medical clearance  Pt states they do not feed him

## 2012-05-06 NOTE — ED Notes (Signed)
Pt in via police, per officer the living center where the patient stays sent the patient over because he left the campus and he needs to be screened before he can return
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
## 2012-05-07 NOTE — ED Notes (Signed)
Talked with daughter Corrie Dandy [id number] she was concerned about who placed her father into a cab last night. I spoke with Lucita Lora from [facility] to verify if the pt had returned. Ms. Tiburcio Pea reported that [facility] called them this am and reported he did not want to go back to the living center, but he was there. The daughter states that she has not talked to him, but was told he was at a facility off of [address]. Ms. Tiburcio Pea reported that he could come back to the facility but was sent here to have an evaluation done due to he was combative and wandered off out of an window at the facility.

## 2012-05-08 ENCOUNTER — Encounter (HOSPITAL_COMMUNITY): Payer: Self-pay | Admitting: *Deleted

## 2012-05-08 ENCOUNTER — Emergency Department (HOSPITAL_COMMUNITY)
Admission: EM | Admit: 2012-05-08 | Discharge: 2012-05-09 | Disposition: A | Discharge status: Home / Self Care | Payer: Medicare Other | Attending: Emergency Medicine | Admitting: Emergency Medicine

## 2012-05-08 DIAGNOSIS — F22 Delusional disorders: Secondary | ICD-10-CM | POA: Insufficient documentation

## 2012-05-08 DIAGNOSIS — R45851 Suicidal ideations: Secondary | ICD-10-CM | POA: Insufficient documentation

## 2012-05-08 LAB — CBC
HCT: 41.5 % (ref 39.0–52.0)
Hemoglobin: 14.3 g/dL (ref 13.0–17.0)
MCHC: 34.5 g/dL (ref 30.0–36.0)
MCV: 88.7 fL (ref 78.0–100.0)
RDW: 15.3 % (ref 11.5–15.5)
WBC: 9.5 10*3/uL (ref 4.0–10.5)

## 2012-05-08 LAB — RAPID URINE DRUG SCREEN, HOSP PERFORMED
Amphetamines: NOT DETECTED
Cocaine: NOT DETECTED
Opiates: NOT DETECTED
Tetrahydrocannabinol: NOT DETECTED

## 2012-05-08 LAB — COMPREHENSIVE METABOLIC PANEL
ALT: 10 U/L (ref 0–53)
AST: 14 U/L (ref 0–37)
BUN: 12 mg/dL (ref 6–23)
Calcium: 10.1 mg/dL (ref 8.4–10.5)
Chloride: 102 mEq/L (ref 96–112)
Creatinine, Ser: 0.71 mg/dL (ref 0.50–1.35)
GFR calc non Af Amer: >90 mL/min (ref >90)
Glucose, Bld: 90 mg/dL (ref 70–99)
Potassium: 3.7 mEq/L (ref 3.5–5.1)
Total Protein: 8.5 g/dL — ABNORMAL HIGH (ref 6.0–8.3)

## 2012-05-08 MED ORDER — QUETIAPINE FUMARATE 100 MG PO TABS: 200.0000 mg | ORAL_TABLET | Freq: Every day | ORAL | Status: DC

## 2012-05-08 MED ORDER — VITAMIN B-1 100 MG PO TABS
100.0000 mg | ORAL_TABLET | Freq: Every day | ORAL | Status: DC
  Administered 2012-05-09: 100 mg via ORAL
  Filled 2012-05-08 (×2): qty 1

## 2012-05-08 MED ORDER — DIVALPROEX SODIUM 250 MG PO DR TAB
250.0000 mg | DELAYED_RELEASE_TABLET | Freq: Two times a day (BID) | ORAL | Status: DC
  Administered 2012-05-09: 250 mg via ORAL
  Filled 2012-05-08: qty 1

## 2012-05-08 MED ORDER — NICOTINE 21 MG/24HR TD PT24
21.0000 mg | MEDICATED_PATCH | Freq: Every day | TRANSDERMAL | Status: DC
  Filled 2012-05-08: qty 1

## 2012-05-08 MED ORDER — DONEPEZIL HCL 5 MG PO TABS
5.0000 mg | ORAL_TABLET | Freq: Every day | ORAL | Status: DC
  Filled 2012-05-08 (×2): qty 1

## 2012-05-08 MED ORDER — ACETAMINOPHEN 325 MG PO TABS: 650.0000 mg | ORAL_TABLET | ORAL | Status: DC | PRN

## 2012-05-08 MED ORDER — IBUPROFEN 600 MG PO TABS: 600.0000 mg | ORAL_TABLET | Freq: Three times a day (TID) | ORAL | Status: DC | PRN

## 2012-05-08 MED ORDER — INSULIN ASPART 100 UNIT/ML ~~LOC~~ SOLN: 3.0000 [IU] | Freq: Two times a day (BID) | SUBCUTANEOUS | Status: DC

## 2012-05-08 MED ORDER — LEVOTHYROXINE SODIUM 112 MCG PO TABS
112.0000 ug | ORAL_TABLET | Freq: Every day | ORAL | Status: DC
  Administered 2012-05-09: 112 ug via ORAL
  Filled 2012-05-08 (×3): qty 1

## 2012-05-08 MED ORDER — ATENOLOL 12.5 MG HALF TABLET
12.5000 mg | ORAL_TABLET | Freq: Two times a day (BID) | ORAL | Status: DC
  Administered 2012-05-09: 12.5 mg via ORAL
  Filled 2012-05-08 (×3): qty 1

## 2012-05-08 MED ORDER — INSULIN ASPART 100 UNIT/ML ~~LOC~~ SOLN: 0.0000 [IU] | Freq: Two times a day (BID) | SUBCUTANEOUS | Status: DC

## 2012-05-08 MED ORDER — ALUM & MAG HYDROXIDE-SIMETH 200-200-20 MG/5ML PO SUSP: 30.0000 mL | ORAL | Status: DC | PRN

## 2012-05-08 MED ORDER — ONDANSETRON HCL 4 MG PO TABS: 4.0000 mg | ORAL_TABLET | Freq: Three times a day (TID) | ORAL | Status: DC | PRN

## 2012-05-08 MED ORDER — SERTRALINE HCL 50 MG PO TABS
50.0000 mg | ORAL_TABLET | Freq: Every day | ORAL | Status: DC
  Administered 2012-05-09: 50 mg via ORAL
  Filled 2012-05-08 (×2): qty 1

## 2012-05-08 NOTE — ED Provider Notes (Signed)
Medical screening examination/treatment/procedure(s) were performed by non-physician practitioner and as supervising physician I was immediately available for consultation/collaboration.   Torah Pinnock, MD 05/08/12 1651 

## 2012-05-08 NOTE — ED Notes (Signed)
Spoke with Child psychotherapist, regarding placement back to nsg home. Requests social work consult for social worker in am to see patient.

## 2012-05-08 NOTE — ED Notes (Addendum)
Pt presents with [place] police and IVC paperwork. Per paperwork pt experiencing grandiose thinking and paranoia with thoughts of suicide. Pt denies SI/HI and states nothing is wrong with him. States he does not taking medication and "feels so good without taking it"

## 2012-05-08 NOTE — ED Notes (Signed)
Here with IVC papers, states has moved out of nursing facility about 1 week ago, here 2 days ago, sent home in [place] cab-- stopped at a minimart, and did not make it back to nursing facility.

## 2012-05-08 NOTE — ED Provider Notes (Signed)
History     CSN: [id number]  Arrival date & time 05/08/12  1352   First MD Initiated Contact with Patient 05/08/12 1555      Chief Complaint  Patient presents with  . V70.1    (Consider location/radiation/quality/duration/timing/severity/associated sxs/prior treatment) HPI  Patient to the ER with IVC paperwork brought in by a length for the police. The patient was discharged from the hospital yesterday and the hospital paid the cab to take patient to nursing facility. However the cab driver the patient to [facility] and then we were unable to track the patient down from there yesterday. Turns out the patient ended up at a girls house. The patient has been IVC been brought back to the ER because, according to the paperwork, the patient is experiencing grandiose thinking, paranoid and thoughts of suicide. Currently the patient states he is not suicidal or homicidal and that nothing is wrong with him. He asks for some applesauce and for some gumbo shrimp that his daughter is making.   I believe the patient is back to the ER so that he can be reevaluated and taken back to the appropriate location.  Past Medical History  Diagnosis Date  . Difficult intubation     Past Surgical History  Procedure Date  . Laceration repair 12/21/2011    Procedure: REPAIR MULTIPLE LACERATIONS;  Surgeon: Barbee Cough;  Location: [facility] OR;  Service: ENT;  Laterality: N/A;  . Orif facial fracture 12/21/2011    Procedure: OPEN REDUCTION INTERNAL FIXATION (ORIF) FACIAL FRACTURE;  Surgeon: Barbee Cough;  Location: [facility] OR;  Service: ENT;  Laterality: Bilateral;  . Percutaneous tracheostomy 12/31/2011    Procedure: PERCUTANEOUS TRACHEOSTOMY;  Surgeon: Liz Malady, MD;  Location: [facility] OR;  Service: General;  Laterality: N/A;  . Peg placement 12/31/2011    Procedure: PERCUTANEOUS ENDOSCOPIC GASTROSTOMY (PEG) PLACEMENT;  Surgeon: Liz Malady, MD;  Location: [facility] OR;  Service: General;  Laterality: N/A;    History  reviewed. No pertinent family history.  History  Substance Use Topics  . Smoking status: Current Everyday Smoker -- 0.2 packs/day for 40 years    Types: Cigarettes  . Smokeless tobacco: Never Used  . Alcohol Use: 5.0 oz/week    10 drink(s) per week      Review of Systems    HEENT: denies blurry vision or change in hearing PULMONARY: Denies difficulty breathing and SOB CARDIAC: denies chest pain or heart palpitations MUSCULOSKELETAL:  denies being unable to ambulate ABDOMEN AL: denies abdominal pain GU: denies loss of bowel or urinary control NEURO: denies numbness and tingling in extremities  Allergies  Review of patient's allergies indicates no known allergies.  Home Medications   Current Outpatient Rx  Name Route Sig Dispense Refill  . ACETAMINOPHEN 500 MG PO TABS Oral Take 500 mg by mouth 3 (three) times daily.    . ATENOLOL 25 MG PO TABS Oral Take 12.5 mg by mouth 2 (two) times daily.    Marland Kitchen DIVALPROEX SODIUM 250 MG PO TBEC Oral Take 250 mg by mouth 2 (two) times daily.    . DONEPEZIL HCL 5 MG PO TBDP Oral Take 5 mg by mouth at bedtime.    Marland Kitchen FOLIC ACID 1 MG PO TABS Oral Take 1 mg by mouth daily.    . IBUPROFEN 200 MG PO TABS Oral Take 200 mg by mouth every 8 (eight) hours as needed. Pain    . INSULIN ASPART 100 UNIT/ML [place] SOLN Subcutaneous Inject 0-9 Units into the  skin 2 (two) times daily. 250>=3 units 350>=5 units 450>=10 units    . LEVOTHYROXINE SODIUM 112 MCG PO TABS Oral Take 112 mcg by mouth daily.    Marland Kitchen PANTOPRAZOLE SODIUM 40 MG PO TBEC Oral Take 40 mg by mouth daily.    . QUETIAPINE FUMARATE 100 MG PO TABS Oral Take 100 mg by mouth at bedtime.    Marland Kitchen QUETIAPINE FUMARATE 200 MG PO TABS Oral Take 200 mg by mouth at bedtime.    . SERTRALINE HCL 50 MG PO TABS Oral Take 50 mg by mouth daily.    . THIAMINE HCL 100 MG PO TABS Oral Take 1 tablet (100 mg total) by mouth daily.    . QUETIAPINE FUMARATE 100 MG PO TABS Per Tube Place 1 tablet (100 mg total) into feeding tube  2 (two) times daily.      BP 142/98  Pulse 73  Temp(Src) 98.4 F (36.9 C) (Oral)  Resp 20  SpO2 100%  Physical Exam  Nursing note and vitals reviewed. Constitutional: He appears well-developed and well-nourished. No distress.  HENT:  Head: Normocephalic and atraumatic.  Eyes: Pupils are equal, round, and reactive to light.  Neck: Normal range of motion. Neck supple.  Cardiovascular: Normal rate and regular rhythm.   Pulmonary/Chest: Effort normal.  Abdominal: Soft.  Neurological: He is alert.  Skin: Skin is warm and dry.    ED Course  Procedures (including critical care time)  Labs Reviewed  COMPREHENSIVE METABOLIC PANEL - Abnormal; Notable for the following:    Total Protein 8.5 (*)    All other components within normal limits  CBC  ETHANOL  ACETAMINOPHEN LEVEL  URINE RAPID DRUG SCREEN (HOSP PERFORMED)   No results found.   1. Paranoia   2. Suicidal ideation       MDM  Cervical was asked to Will also consult case management to get patient to the appropriate location.      Dorthula Matas, PA 05/08/12 [phone]

## 2012-05-09 NOTE — ED Notes (Addendum)
MD is agreeable with pt to return to [facility] with information for community resources. Pt presented Clinical research associate with documentation stating that he is able to reside there for 30 days. CSW will provide pt with resource information prior to discharge.

## 2012-05-09 NOTE — ED Notes (Signed)
CALLED TO CHECK TELEPSYCH, WAS TOLD THAT DR HAS A FEW IN QUEUE AND IT'S TAKING HIM SOME TIME TO GET TO ALL OF THEM.

## 2012-05-09 NOTE — Discharge Instructions (Signed)
Return if  Concerned for any reeason

## 2012-05-09 NOTE — ED Notes (Signed)
Client was discharged to [facility] and sent there via [facility]. Pt agreed to go directly to the [employer] only. Pt was given back his bag of belongings, he declined to change his clothes here he said he wanted to change after showering at the [facility]. Pt denies SI/HI/AVH. Client was walked up front and cab voucher given to RN at the window up front. Pt said he'll wait outside to get warm.

## 2012-05-09 NOTE — ED Notes (Signed)
Pt did say he has no problems with his throat or swallowing or anything so he doesn't need the medicine but he did take the medicine without incident though he said that he will throw it up if his body doesn't really need it. SW came in to talk with pt as Clinical research associate was leaving.

## 2012-05-09 NOTE — ED Notes (Signed)
Specialist On Call has psychiatrically cleared pt for discharge. Pt informed Clinical research associate that he does not have a POA at this time and desires to be discharged to [facility] (where he has resided for the past 3 weeks) oppose to going back to [facility]. CSW telephoned pt's daughter Ray[place] Citizen [phone]) to provide update on disposition. CSW informed pt's daughter that pt desires to go to [facility] instead of his ALF.  Pt's daughter recommended writer to speak with his other daughter Essie Christine [phone]) to inform her.  CSW telephoned Essie Christine who verbalized "I don't care where he goes. I'm through with it." Pt's daughter reported that she and pt have already discussed contacting his former ALF to obtain his fiances. Pt's daughter stated that pt may be discharged to go back to [facility] since he desires to.    CSW will consult with MD to determine plan of disposition.

## 2012-05-09 NOTE — ED Notes (Signed)
PT UP TO BATHROOM.

## 2012-05-09 NOTE — ED Provider Notes (Addendum)
Seen by alert cooperative ambulates without difficulty. Awaiting placement  Doug Sou, MD 05/09/12 [phone]  Addendum act memeber had discussion with 2 daughters who declined to take patient to their homes. Patient prefers to go to the [facility], where he is in good standing and has stayed before.. he was offered to the patient to find him another assisted living facility which he declines.Marland Kitchen Specialist on-call psychiatry Has evaluated patient and deemed that he is stable for discharge without change in medications. Patient is not feeling suicidal. Psychiatry feels no evidence of dementia   Doug Sou, MD 05/09/12 [id number]  Doug Sou, MD 05/09/12 1724

## 2012-05-09 NOTE — ED Notes (Signed)
Writer spoke with pt's daughter who reported that she desires for pt to return back to his assisted living facility if he is cleared by Specialist on Call. Pt's daughter stated that she is unaware if currently has a POA although his financial income goes towards his care at [facility]. In prior conversation pt reported that his other daughter Vikki Ports was his financial POA prior to be placed at his ALF. CSW informed daughter that she will be notified of pt's plan of disposition after his evaluation.

## 2012-05-09 NOTE — ED Notes (Signed)
SW in talking to pt again

## 2012-05-09 NOTE — ED Notes (Signed)
MD consulted writer to determine if pt could be transported to [facility] via cab oppose to public transit due it being more appropriate for a safe discharge. CSW telephoned CSW supervisor for approval. Pt was provided taxi voucher and informed that taxi driver is to take him straight to [facility] with no stops. Pt verbalized his understanding. No others concerns presented at this time.

## 2012-05-09 NOTE — ED Notes (Signed)
Pt is IVC desires to be discharged to [facility] oppose to his ALF [facility] if psychiatrically cleared by Specialist on Call. Telepsych consult initiated to determine plan of disposition.

## 2012-05-09 NOTE — ED Notes (Signed)
SW in talking to pt now.

## 2012-05-09 NOTE — ED Notes (Signed)
Pt up to bathroom.

## 2012-08-27 ENCOUNTER — Emergency Department (HOSPITAL_COMMUNITY): Payer: Medicare Other

## 2012-08-27 ENCOUNTER — Observation Stay (HOSPITAL_COMMUNITY): Payer: Medicare Other

## 2012-08-27 ENCOUNTER — Encounter (HOSPITAL_COMMUNITY): Payer: Self-pay | Admitting: *Deleted

## 2012-08-27 ENCOUNTER — Observation Stay (HOSPITAL_COMMUNITY)
Admission: EM | Admit: 2012-08-27 | Discharge: 2012-08-31 | Discharge status: Against Medical Advice | Payer: Medicare Other | Attending: Internal Medicine | Admitting: Internal Medicine

## 2012-08-27 DIAGNOSIS — R55 Syncope and collapse: Principal | ICD-10-CM | POA: Insufficient documentation

## 2012-08-27 DIAGNOSIS — K5900 Constipation, unspecified: Secondary | ICD-10-CM | POA: Insufficient documentation

## 2012-08-27 DIAGNOSIS — E039 Hypothyroidism, unspecified: Secondary | ICD-10-CM | POA: Insufficient documentation

## 2012-08-27 DIAGNOSIS — M25519 Pain in unspecified shoulder: Secondary | ICD-10-CM | POA: Insufficient documentation

## 2012-08-27 DIAGNOSIS — Z23 Encounter for immunization: Secondary | ICD-10-CM | POA: Insufficient documentation

## 2012-08-27 DIAGNOSIS — N400 Enlarged prostate without lower urinary tract symptoms: Secondary | ICD-10-CM | POA: Diagnosis present

## 2012-08-27 DIAGNOSIS — R0602 Shortness of breath: Secondary | ICD-10-CM

## 2012-08-27 DIAGNOSIS — F329 Major depressive disorder, single episode, unspecified: Secondary | ICD-10-CM | POA: Insufficient documentation

## 2012-08-27 DIAGNOSIS — R351 Nocturia: Secondary | ICD-10-CM | POA: Insufficient documentation

## 2012-08-27 DIAGNOSIS — F17200 Nicotine dependence, unspecified, uncomplicated: Secondary | ICD-10-CM | POA: Insufficient documentation

## 2012-08-27 DIAGNOSIS — M25511 Pain in right shoulder: Secondary | ICD-10-CM

## 2012-08-27 DIAGNOSIS — R296 Repeated falls: Secondary | ICD-10-CM

## 2012-08-27 DIAGNOSIS — M79641 Pain in right hand: Secondary | ICD-10-CM

## 2012-08-27 DIAGNOSIS — R51 Headache: Secondary | ICD-10-CM | POA: Insufficient documentation

## 2012-08-27 DIAGNOSIS — M79609 Pain in unspecified limb: Secondary | ICD-10-CM | POA: Insufficient documentation

## 2012-08-27 DIAGNOSIS — E119 Type 2 diabetes mellitus without complications: Secondary | ICD-10-CM | POA: Insufficient documentation

## 2012-08-27 DIAGNOSIS — F3289 Other specified depressive episodes: Secondary | ICD-10-CM | POA: Insufficient documentation

## 2012-08-27 DIAGNOSIS — Z720 Tobacco use: Secondary | ICD-10-CM | POA: Diagnosis present

## 2012-08-27 DIAGNOSIS — R350 Frequency of micturition: Secondary | ICD-10-CM

## 2012-08-27 DIAGNOSIS — I10 Essential (primary) hypertension: Secondary | ICD-10-CM | POA: Insufficient documentation

## 2012-08-27 HISTORY — DX: Anesthesia of skin: R20.0

## 2012-08-27 HISTORY — DX: Unspecified osteoarthritis, unspecified site: M19.90

## 2012-08-27 HISTORY — DX: Depression, unspecified: F32.A

## 2012-08-27 HISTORY — DX: Pneumonia, unspecified organism: J18.9

## 2012-08-27 HISTORY — DX: Other chronic pain: G89.29

## 2012-08-27 HISTORY — DX: Alcohol dependence, in remission: F10.21

## 2012-08-27 HISTORY — DX: Headache, unspecified: R51.9

## 2012-08-27 HISTORY — DX: Pain in thoracic spine: M54.6

## 2012-08-27 HISTORY — DX: Dorsalgia, unspecified: M54.9

## 2012-08-27 HISTORY — DX: Paresthesia of skin: R20.2

## 2012-08-27 HISTORY — DX: Inflammatory liver disease, unspecified: K75.9

## 2012-08-27 HISTORY — DX: Nontraumatic subarachnoid hemorrhage, unspecified: I60.9

## 2012-08-27 HISTORY — DX: Pure hypercholesterolemia, unspecified: E78.00

## 2012-08-27 HISTORY — DX: Angina pectoris, unspecified: I20.9

## 2012-08-27 HISTORY — DX: Repeated falls: R29.6

## 2012-08-27 HISTORY — DX: Tobacco use: Z72.0

## 2012-08-27 HISTORY — DX: Headache: R51

## 2012-08-27 HISTORY — DX: Essential (primary) hypertension: I10

## 2012-08-27 HISTORY — DX: Personal history of other diseases of the musculoskeletal system and connective tissue: Z87.39

## 2012-08-27 HISTORY — DX: Shortness of breath: R06.02

## 2012-08-27 HISTORY — DX: Major depressive disorder, single episode, unspecified: F32.9

## 2012-08-27 HISTORY — DX: Personal history of other medical treatment: Z92.89

## 2012-08-27 HISTORY — DX: Frequency of micturition: R35.0

## 2012-08-27 HISTORY — DX: Hypothyroidism, unspecified: E03.9

## 2012-08-27 LAB — TROPONIN I: Troponin I: <0.3 ng/mL (ref <0.30)

## 2012-08-27 LAB — CBC WITH DIFFERENTIAL/PLATELET
Basophils Relative: 1 % (ref 0–1)
Eosinophils Absolute: 0.1 10*3/uL (ref 0.0–0.7)
HCT: 44.4 % (ref 39.0–52.0)
Hemoglobin: 16.4 g/dL (ref 13.0–17.0)
Lymphs Abs: 2.5 10*3/uL (ref 0.7–4.0)
MCH: 34.5 pg — ABNORMAL HIGH (ref 26.0–34.0)
MCHC: 36.9 g/dL — ABNORMAL HIGH (ref 30.0–36.0)
Monocytes Absolute: 0.5 10*3/uL (ref 0.1–1.0)
Monocytes Relative: 7 % (ref 3–12)
Neutrophils Relative %: 60 % (ref 43–77)
RBC: 4.76 MIL/uL (ref 4.22–5.81)

## 2012-08-27 LAB — URINE MICROSCOPIC-ADD ON

## 2012-08-27 LAB — URINALYSIS, ROUTINE W REFLEX MICROSCOPIC
Glucose, UA: 100 mg/dL — AB
Protein, ur: NEGATIVE mg/dL
Specific Gravity, Urine: 1.025 (ref 1.005–1.030)
Urobilinogen, UA: 0.2 mg/dL (ref 0.0–1.0)

## 2012-08-27 LAB — COMPREHENSIVE METABOLIC PANEL
ALT: 10 U/L (ref 0–53)
AST: 14 U/L (ref 0–37)
Albumin: 3.4 g/dL — ABNORMAL LOW (ref 3.5–5.2)
Calcium: 9.5 mg/dL (ref 8.4–10.5)
Creatinine, Ser: 0.79 mg/dL (ref 0.50–1.35)
Sodium: 134 mEq/L — ABNORMAL LOW (ref 135–145)
Total Protein: 7.2 g/dL (ref 6.0–8.3)

## 2012-08-27 LAB — RAPID URINE DRUG SCREEN, HOSP PERFORMED
Barbiturates: NOT DETECTED
Opiates: NOT DETECTED
Tetrahydrocannabinol: POSITIVE — AB

## 2012-08-27 LAB — HEMOGLOBIN A1C
Hgb A1c MFr Bld: 5.4 % (ref <5.7)
Mean Plasma Glucose: 108 mg/dL (ref <117)

## 2012-08-27 LAB — VITAMIN B12: Vitamin B-12: 770 pg/mL (ref 211–911)

## 2012-08-27 LAB — TSH: TSH: 34.774 u[IU]/mL — ABNORMAL HIGH (ref 0.350–4.500)

## 2012-08-27 LAB — GLUCOSE, CAPILLARY
Glucose-Capillary: 141 mg/dL — ABNORMAL HIGH (ref 70–99)
Glucose-Capillary: 118 mg/dL — ABNORMAL HIGH (ref 70–99)

## 2012-08-27 MED ORDER — SODIUM CHLORIDE 0.9 % IJ SOLN
3.0000 mL | Freq: Two times a day (BID) | INTRAMUSCULAR | Status: DC
  Administered 2012-08-27 – 2012-08-30 (×7): 3 mL via INTRAVENOUS

## 2012-08-27 MED ORDER — ENOXAPARIN SODIUM 40 MG/0.4ML ~~LOC~~ SOLN
40.0000 mg | SUBCUTANEOUS | Status: DC
  Administered 2012-08-27 – 2012-08-30 (×4): 40 mg via SUBCUTANEOUS
  Filled 2012-08-27 (×5): qty 0.4

## 2012-08-27 MED ORDER — ACETAMINOPHEN 650 MG RE SUPP: 650.0000 mg | Freq: Four times a day (QID) | RECTAL | Status: DC | PRN

## 2012-08-27 MED ORDER — INFLUENZA VIRUS VACC SPLIT PF IM SUSP
0.5000 mL | INTRAMUSCULAR | Status: AC
  Administered 2012-08-28: 0.5 mL via INTRAMUSCULAR
  Filled 2012-08-27: qty 0.5

## 2012-08-27 MED ORDER — ASPIRIN EC 81 MG PO TBEC
81.0000 mg | DELAYED_RELEASE_TABLET | Freq: Every day | ORAL | Status: DC
  Administered 2012-08-27 – 2012-08-30 (×4): 81 mg via ORAL
  Filled 2012-08-27 (×5): qty 1

## 2012-08-27 MED ORDER — ACETAMINOPHEN 325 MG PO TABS: 650.0000 mg | ORAL_TABLET | Freq: Four times a day (QID) | ORAL | Status: DC | PRN

## 2012-08-27 MED ORDER — FENTANYL CITRATE 0.05 MG/ML IJ SOLN
25.0000 ug | Freq: Once | INTRAMUSCULAR | Status: AC
  Administered 2012-08-27: 25 ug via INTRAVENOUS
  Filled 2012-08-27: qty 2

## 2012-08-27 MED ORDER — SENNA 8.6 MG PO TABS
1.0000 | ORAL_TABLET | Freq: Every day | ORAL | Status: DC
  Administered 2012-08-27 – 2012-08-30 (×4): 8.6 mg via ORAL
  Filled 2012-08-27 (×5): qty 1

## 2012-08-27 MED ORDER — INSULIN ASPART 100 UNIT/ML ~~LOC~~ SOLN
0.0000 [IU] | Freq: Three times a day (TID) | SUBCUTANEOUS | Status: DC
  Administered 2012-08-30: 2 [IU] via SUBCUTANEOUS

## 2012-08-27 MED ORDER — HYDROCODONE-ACETAMINOPHEN 5-325 MG PO TABS
1.0000 | ORAL_TABLET | ORAL | Status: DC | PRN
  Administered 2012-08-27 (×2): 1 via ORAL
  Administered 2012-08-28 (×3): 2 via ORAL
  Administered 2012-08-29: 1 via ORAL
  Filled 2012-08-27: qty 2
  Filled 2012-08-27: qty 1
  Filled 2012-08-27: qty 2
  Filled 2012-08-27 (×2): qty 1
  Filled 2012-08-27: qty 2
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
## 2012-08-27 NOTE — ED Notes (Signed)
Pt resting quielty. C/o pain in rt shoulder. Rates pain at 6/10
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
## 2012-08-27 NOTE — ED Notes (Signed)
Pt in xray
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
## 2012-08-27 NOTE — ED Notes (Signed)
Pt states that over the last several weeks he has been having increased weakness and dizziness.  Over the last week he has had frequent falls.  He states that he is out of his meds and is needing prescriptions.

## 2012-08-28 DIAGNOSIS — R55 Syncope and collapse: Secondary | ICD-10-CM

## 2012-08-28 LAB — GLUCOSE, CAPILLARY
Glucose-Capillary: 89 mg/dL (ref 70–99)
Glucose-Capillary: 98 mg/dL (ref 70–99)

## 2012-08-28 LAB — LIPID PANEL
Cholesterol: 150 mg/dL (ref 0–200)
HDL: 56 mg/dL (ref >39)
LDL Cholesterol: 77 mg/dL (ref 0–99)
Total CHOL/HDL Ratio: 2.7 RATIO
VLDL: 17 mg/dL (ref 0–40)

## 2012-08-28 LAB — TROPONIN I: Troponin I: <0.3 ng/mL (ref <0.30)

## 2012-08-28 LAB — BASIC METABOLIC PANEL
BUN: 9 mg/dL (ref 6–23)
CO2: 24 mEq/L (ref 19–32)
Calcium: 9.4 mg/dL (ref 8.4–10.5)
Chloride: 103 mEq/L (ref 96–112)
Creatinine, Ser: 0.69 mg/dL (ref 0.50–1.35)
Glucose, Bld: 94 mg/dL (ref 70–99)

## 2012-08-28 MED ORDER — LEVOTHYROXINE SODIUM 112 MCG PO TABS
112.0000 ug | ORAL_TABLET | Freq: Every day | ORAL | Status: DC
  Administered 2012-08-28 – 2012-08-30 (×3): 112 ug via ORAL
  Filled 2012-08-28 (×6): qty 1

## 2012-08-28 MED ORDER — FINASTERIDE 5 MG PO TABS
5.0000 mg | ORAL_TABLET | Freq: Every day | ORAL | Status: DC
  Administered 2012-08-28 – 2012-08-30 (×3): 5 mg via ORAL
  Filled 2012-08-28 (×4): qty 1
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
## 2012-08-28 NOTE — Progress Notes (Addendum)
Noted  Request for case management assistance for multiple needs. Also noted that pt has Medicare and Medicaid. CM unable to assist with meds as pt has minimal copay. Will assist with when specific needs identified. Will continue to follow for progression. Monitoring closely due to OBS status. Johny Shock RN MPH Case Manager [phone]

## 2012-08-28 NOTE — ED Provider Notes (Signed)
I saw and evaluated the patient, reviewed the resident's note and I agree with the findings and plan. Please see separate ED provider note.   Dione Booze, MD 08/28/12 [phone]
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
## 2012-08-28 NOTE — Progress Notes (Signed)
Clinical Social Work Department BRIEF PSYCHOSOCIAL ASSESSMENT 08/28/2012  Patient:  Dakota Stewart, Dakota Stewart     Account Number:  [id number]     Admit date:  08/27/2012  Clinical Social Worker:  Conley Simmonds  Date/Time:  08/28/2012 03:00 PM  Referred by:  Physician  Date Referred:  08/28/2012 Referred for  Homelessness   Other Referral:   Interview type:  Patient Other interview type:    PSYCHOSOCIAL DATA Living Status:  FRIEND(S) Admitted from facility:   Level of care:   Primary support name:  Dakota Stewart Primary support relationship to patient:  CHILD, ADULT Degree of support available:   Severly lacking    CURRENT CONCERNS  Other Concerns:    SOCIAL WORK ASSESSMENT / PLAN CSW met with pt at bedside to discuss possible d/c planning-pt very adamant that he will never return to SNF or ALF-pt has been currently staying with a friend however he relays that he is unable to return due to a friends safety concerns-  CSW reviewed ALF options however pt remains adamant that he will never go to any facility and he would rather d/c to the street than a facility-  CSW unsure of options at this time and would recommend capacity-  Due to pts multiple insurance there are very limited options with regards to financial assistance-  CSW will discuss with supervisor and f/u   Assessment/plan status:  Psychosocial Support/Ongoing Assessment of Needs Other assessment/ plan:   Information/referral to community resources:   DSS/SSDI    PATIENT'S/FAMILY'S RESPONSE TO PLAN OF CARE: Pt very easily agitated with regards to discussions around d/c planning and returning to any kind of facility-Pt was however very appreciative of treatment team concerns and efforts in assisting him with   Pt declines any and all d/c assistance with regards to facility and can not remember when he was last at the shelter-  CSW will leave weekend report to f/u with regards to shelter plans-  Jodean Lima, [phone]
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
## 2012-08-29 LAB — GLUCOSE, CAPILLARY: Glucose-Capillary: 141 mg/dL — ABNORMAL HIGH (ref 70–99)

## 2012-08-29 MED ORDER — SERTRALINE HCL 50 MG PO TABS
50.0000 mg | ORAL_TABLET | Freq: Every day | ORAL | Status: DC
  Administered 2012-08-29 – 2012-08-30 (×2): 50 mg via ORAL
  Filled 2012-08-29 (×3): qty 1
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
## 2012-08-30 LAB — GLUCOSE, CAPILLARY
Glucose-Capillary: 90 mg/dL (ref 70–99)
Glucose-Capillary: 176 mg/dL — ABNORMAL HIGH (ref 70–99)
Glucose-Capillary: 161 mg/dL — ABNORMAL HIGH (ref 70–99)
Glucose-Capillary: 91 mg/dL (ref 70–99)

## 2012-08-30 MED ORDER — POLYETHYLENE GLYCOL 3350 17 G PO PACK
17.0000 g | PACK | Freq: Every day | ORAL | Status: DC
  Administered 2012-08-30: 17 g via ORAL
  Filled 2012-08-30 (×2): qty 1

## 2012-08-30 NOTE — Progress Notes (Signed)
Subjective: No acute events overnight, awaiting ALF placement  Objective: Vital signs in last 24 hours: Filed Vitals:   08/29/12 0548 08/29/12 1500 08/29/12 2200 08/30/12 0600  BP: 124/72 130/74 146/87 107/69  Pulse: 53 62 55 64  Temp: 97.8 F (36.6 C) 98 F (36.7 C) 98 F (36.7 C) 97.9 F (36.6 C)  TempSrc: Oral     Resp: 15 17 17 16   Height:      Weight:      SpO2: 98% 97% 98% 99%   Weight change:   Intake/Output Summary (Last 24 hours) at 08/30/12 1121 Last data filed at 08/30/12 1001  Gross per 24 hour  Intake      3 ml  Output      0 ml  Net      3 ml    Physical Exam: General: awake in bed, NAD CV: Regular rate and rhythm; no murmurs, rubs, or gallops Resp: Clear to auscultation bilaterally; no wheezes, rales, or rhonchi GI/Abd: Soft, non-tender, non-distended; normoactive bowel sounds Ext: no edema, clubbing, or cyanosis Skin: Warm, dry, intact   Lab Results: Basic Metabolic Panel:  Lab 08/28/12 1610 08/27/12 0830  NA 137 134*  K 3.6 3.6  CL 103 101  CO2 24 27  GLUCOSE 94 133*  BUN 9 8  CREATININE 0.69 0.79  CALCIUM 9.4 9.5  MG -- --  PHOS -- --   Liver Function Tests:  Lab 08/27/12 0830  AST 14  ALT 10  ALKPHOS 65  BILITOT 0.6  PROT 7.2  ALBUMIN 3.4*   CBC:  Lab 08/27/12 0830  WBC 7.9  NEUTROABS 4.7  HGB 16.4  HCT 44.4  MCV 93.3  PLT 175   Cardiac Enzymes:  Lab 08/28/12 0010 08/27/12 1736 08/27/12 1215  CKTOTAL -- -- --  CKMB -- -- --  CKMBINDEX -- -- --  TROPONINI <0.30 <0.30 <0.30   CBG:  Lab 08/30/12 0729 08/29/12 1701 08/29/12 1149 08/29/12 0728 08/28/12 2032 08/28/12 1655  GLUCAP 90 141* 79 81 106* 98   Hemoglobin A1C:  Lab 08/27/12 1215  HGBA1C 5.4   Fasting Lipid Panel:  Lab 08/28/12 0640  CHOL 150  HDL 56  LDLCALC 77  TRIG 87  CHOLHDL 2.7  LDLDIRECT --   Thyroid Function Tests:  Lab 08/27/12 1215  TSH 34.774*  T4TOTAL --  FREET4 --  T3FREE --  THYROIDAB --   Anemia Panel:  Lab 08/27/12  1215  VITAMINB12 770  FOLATE >20.0  FERRITIN --  TIBC --  IRON --  RETICCTPCT --   Urine Drug Screen: Drugs of Abuse     Component Value Date/Time   LABOPIA NONE DETECTED 08/27/2012 0834   COCAINSCRNUR NONE DETECTED 08/27/2012 0834   LABBENZ NONE DETECTED 08/27/2012 0834   AMPHETMU NONE DETECTED 08/27/2012 0834   THCU POSITIVE* 08/27/2012 0834   LABBARB NONE DETECTED 08/27/2012 0834    Urinalysis:  Lab 08/27/12 0834  COLORURINE AMBER*  LABSPEC 1.025  PHURINE 5.5  GLUCOSEU 100*  HGBUR TRACE*  BILIRUBINUR SMALL*  KETONESUR 15*  PROTEINUR NEGATIVE  UROBILINOGEN 0.2  NITRITE NEGATIVE  LEUKOCYTESUR NEGATIVE   Studies/Results: No results found. Medications: I have reviewed the patient's current medications. Scheduled Meds:    . aspirin EC  81 mg Oral Daily  . enoxaparin (LOVENOX) injection  40 mg Subcutaneous Q24H  . finasteride  5 mg Oral Daily  . insulin aspart  0-9 Units Subcutaneous TID WC  . levothyroxine  112 mcg Oral QAC breakfast  .  senna  1 tablet Oral Daily  . sertraline  50 mg Oral Daily  . sodium chloride  3 mL Intravenous Q12H   Continuous Infusions:  PRN Meds:.acetaminophen, acetaminophen, HYDROcodone-acetaminophen  Assessment/Plan: HD#3 for 66 y.o. M admitted for recurrent episodes of syncope and pre-syncope occurring since 04/2012, with unclear etiology.  Syncope - likely neurogenic with prior episodes of post-micturition syncope and syncope/pre-syncope on standing from sitting associated with prodrome. Cardiovascular causes are less likely, given patient's benign EKG and no events on telemetry overnight. Patient also states he has had poor PO intake secondary to issues with chewing food without pain, which has been persistent since his PEG tube was removed in 04/2012. - continue to monitor on telemetry until discharge  Hypothyroidism - TSH ~35. Patient treated in the past for hypothyroidism, which improved TSH from ~20 to ~3 following levothyroxine  therapy. - cont levothyroxine 112ug/day (previous dose)  Headache - chronic. No complaints today. - continue tylenol, norco PRN  Chronic right shoulder and hip pain. Likely secondary to rotator cuff insufficiency, There are no imaging from his hip in records. PT/OT seeing patient (awaiting recs). Received dose of hydrocodone this morning which he states has relieved his discomfort -cont prn Hydrocodone-APAP 5/325 mg  Urinary hesitancy -  Likely secondary to BPH, finasteride started yesterday -cont finasteride  Depression- With prior SI, Possibly secondary to hypothyroidism. No suicidal plan during this admission and appears to be in good spirits today  - cont levothyroxine - start Zoloft 50 mg qd  Constipation - likely 2/2 hypothyroidism. No stools since admission. No c/o abd discomfort. -start Miralax - cont Senna daily  - cont levothyroxine  Hypertension- blood pressure at goal with antihypertensives - continuing to hold medication in the setting of dizziness and history of syncope  History of diabetes - A1c = 5.4.  -no intervention needed  Tobacco abuse - Smoking cessation counseling   DVT prophylaxis - Lovenox  Disposition- pt now aggreable to Assisted Living Placement, concerned about particular location of ALF -appreciate SW assistance in ALF placement    LOS: 3 days   Maitland Muhlbauer 08/30/2012, 11:21 AM

## 2012-08-31 LAB — GLUCOSE, CAPILLARY: Glucose-Capillary: 94 mg/dL (ref 70–99)

## 2012-09-01 NOTE — Progress Notes (Signed)
Subjective: Patient seen the morning of 08/31/2012. Relayed that he was ready to leave the hospital, and was tired of being there. Risks regarding leaving early were made apparent to him, as they had been on a previous discussion on 08/28/2012 when patient was wanting to leave AMA at that time as well.   The patient states he does not want to go to an ALF, despite having been agreeable to it on the day prior. He was informed that no one was attempting to force him to live somewhere he did not wish, but were only wanting to provide him with living options given his financial difficulties.   Shortly after this conversation, the patient decided that he would indeed be leaving AMA.   Objective: Vital signs in last 24 hours: Filed Vitals:   08/30/12 0600 08/30/12 1401 08/30/12 2130 08/31/12 0530  BP: 107/69 148/88 152/89 130/71  Pulse: 64 62 76 83  Temp: 97.9 F (36.6 C) 98 F (36.7 C) 98.1 F (36.7 C) 98.1 F (36.7 C)  TempSrc:      Resp: 16 18 18 16   Height:      Weight:      SpO2: 99% 100% 99% 95%   Weight change:  No intake or output data in the 24 hours ending 09/01/12 1717  Physical Exam: General: Awake and alert; NAD CV: Regular rate and rhythm; no murmurs, rubs, or gallops Resp: Clear to auscultation bilaterally; no wheezes, rales, or rhonchi GI/Abd: Soft, non-tender, non-distended; normoactive bowel sounds Ext: 2+ pulses in all extremities; no edema, clubbing, or cyanosis Skin: Warm, dry, intact  Lab Results: Basic Metabolic Panel:  Lab 08/28/12 4540 08/27/12 0830  NA 137 134*  K 3.6 3.6  CL 103 101  CO2 24 27  GLUCOSE 94 133*  BUN 9 8  CREATININE 0.69 0.79  CALCIUM 9.4 9.5  MG -- --  PHOS -- --   Liver Function Tests:  Lab 08/27/12 0830  AST 14  ALT 10  ALKPHOS 65  BILITOT 0.6  PROT 7.2  ALBUMIN 3.4*   CBC:  Lab 08/27/12 0830  WBC 7.9  NEUTROABS 4.7  HGB 16.4  HCT 44.4  MCV 93.3  PLT 175   Cardiac Enzymes:  Lab 08/28/12 0010 08/27/12 1736  08/27/12 1215  CKTOTAL -- -- --  CKMB -- -- --  CKMBINDEX -- -- --  TROPONINI <0.30 <0.30 <0.30   CBG:  Lab 08/31/12 0729 08/30/12 2111 08/30/12 1643 08/30/12 1201 08/30/12 0729 08/29/12 2045  GLUCAP 94 91 161* 176* 90 87   Hemoglobin A1C:  Lab 08/27/12 1215  HGBA1C 5.4   Fasting Lipid Panel:  Lab 08/28/12 0640  CHOL 150  HDL 56  LDLCALC 77  TRIG 87  CHOLHDL 2.7  LDLDIRECT --   Thyroid Function Tests:  Lab 08/27/12 1215  TSH 34.774*  T4TOTAL --  FREET4 --  T3FREE --  THYROIDAB --   Anemia Panel:  Lab 08/27/12 1215  VITAMINB12 770  FOLATE >20.0  FERRITIN --  TIBC --  IRON --  RETICCTPCT --   Urine Drug Screen: Drugs of Abuse     Component Value Date/Time   LABOPIA NONE DETECTED 08/27/2012 0834   COCAINSCRNUR NONE DETECTED 08/27/2012 0834   LABBENZ NONE DETECTED 08/27/2012 0834   AMPHETMU NONE DETECTED 08/27/2012 0834   THCU POSITIVE* 08/27/2012 0834   LABBARB NONE DETECTED 08/27/2012 0834    Urinalysis:  Lab 08/27/12 0834  COLORURINE AMBER*  LABSPEC 1.025  PHURINE 5.5  GLUCOSEU 100*  HGBUR TRACE*  BILIRUBINUR SMALL*  KETONESUR 15*  PROTEINUR NEGATIVE  UROBILINOGEN 0.2  NITRITE NEGATIVE  LEUKOCYTESUR NEGATIVE   Assessment/Plan: Patient left AMA on the morning of 08/31/2012 (although refusing to sign AMA paperwork), precluding any further management of his medical issues.   Clarita Mcelvain R 09/01/2012, 5:17 PM

## 2012-09-01 NOTE — Progress Notes (Signed)
Physical Therapy Note  Late entry for G-codes.     September 07, 2012 0805  PT Visit Information  Last PT Received On 09/11/2012  PT G-Codes **NOT FOR INPATIENT CLASS**  Functional Assessment Tool Used Clinical Judgement  Functional Limitation Mobility: Walking and moving around  Mobility: Walking and Moving Around Current Status [phone]) CI  Mobility: Walking and Moving Around Goal Status [phone]) [facility]    Jerian Morais, PT [phone]

## 2012-09-01 NOTE — Progress Notes (Signed)
Pt wanting to be discharge right now, notified Dr.Mctyre, stated pt would have to leave AMA. Pt very unsteady on feet. Given his risk if pt leaves without MD approval. Pt stated he was leaving now. Pt refused to sign AMA. Tried to convince pt to stay, even when taken outside. Pt homeless, has no ride. Pt leaving on foot. Pt very unsteady and stumbling around. Asked once again if pt wanted to return to room. He said no, he would make it somehow. Emelda Brothers RN
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
## 2012-12-25 ENCOUNTER — Emergency Department (HOSPITAL_COMMUNITY)
Admission: EM | Admit: 2012-12-25 | Discharge: 2012-12-25 | Disposition: A | Discharge status: Home / Self Care | Payer: Medicare Other | Attending: Emergency Medicine | Admitting: Emergency Medicine

## 2012-12-25 ENCOUNTER — Encounter (HOSPITAL_COMMUNITY): Payer: Self-pay | Admitting: Emergency Medicine

## 2012-12-25 ENCOUNTER — Emergency Department (HOSPITAL_COMMUNITY): Payer: Medicare Other

## 2012-12-25 DIAGNOSIS — Z87448 Personal history of other diseases of urinary system: Secondary | ICD-10-CM | POA: Insufficient documentation

## 2012-12-25 DIAGNOSIS — Z8679 Personal history of other diseases of the circulatory system: Secondary | ICD-10-CM | POA: Insufficient documentation

## 2012-12-25 DIAGNOSIS — S6990XA Unspecified injury of unspecified wrist, hand and finger(s), initial encounter: Secondary | ICD-10-CM | POA: Insufficient documentation

## 2012-12-25 DIAGNOSIS — B6790 Echinococcosis, unspecified: Secondary | ICD-10-CM | POA: Insufficient documentation

## 2012-12-25 DIAGNOSIS — F329 Major depressive disorder, single episode, unspecified: Secondary | ICD-10-CM | POA: Insufficient documentation

## 2012-12-25 DIAGNOSIS — Z8701 Personal history of pneumonia (recurrent): Secondary | ICD-10-CM | POA: Insufficient documentation

## 2012-12-25 DIAGNOSIS — Z8639 Personal history of other endocrine, nutritional and metabolic disease: Secondary | ICD-10-CM | POA: Insufficient documentation

## 2012-12-25 DIAGNOSIS — F3289 Other specified depressive episodes: Secondary | ICD-10-CM | POA: Insufficient documentation

## 2012-12-25 DIAGNOSIS — E162 Hypoglycemia, unspecified: Secondary | ICD-10-CM

## 2012-12-25 DIAGNOSIS — M79643 Pain in unspecified hand: Secondary | ICD-10-CM

## 2012-12-25 DIAGNOSIS — F1410 Cocaine abuse, uncomplicated: Secondary | ICD-10-CM | POA: Insufficient documentation

## 2012-12-25 DIAGNOSIS — Z8719 Personal history of other diseases of the digestive system: Secondary | ICD-10-CM | POA: Insufficient documentation

## 2012-12-25 DIAGNOSIS — Y9389 Activity, other specified: Secondary | ICD-10-CM | POA: Insufficient documentation

## 2012-12-25 DIAGNOSIS — F17200 Nicotine dependence, unspecified, uncomplicated: Secondary | ICD-10-CM | POA: Insufficient documentation

## 2012-12-25 DIAGNOSIS — E1169 Type 2 diabetes mellitus with other specified complication: Secondary | ICD-10-CM | POA: Insufficient documentation

## 2012-12-25 DIAGNOSIS — F1210 Cannabis abuse, uncomplicated: Secondary | ICD-10-CM | POA: Insufficient documentation

## 2012-12-25 DIAGNOSIS — Z8739 Personal history of other diseases of the musculoskeletal system and connective tissue: Secondary | ICD-10-CM | POA: Insufficient documentation

## 2012-12-25 DIAGNOSIS — W108XXA Fall (on) (from) other stairs and steps, initial encounter: Secondary | ICD-10-CM | POA: Insufficient documentation

## 2012-12-25 DIAGNOSIS — M6530 Trigger finger, unspecified finger: Secondary | ICD-10-CM | POA: Insufficient documentation

## 2012-12-25 DIAGNOSIS — Z8709 Personal history of other diseases of the respiratory system: Secondary | ICD-10-CM | POA: Insufficient documentation

## 2012-12-25 DIAGNOSIS — Z862 Personal history of diseases of the blood and blood-forming organs and certain disorders involving the immune mechanism: Secondary | ICD-10-CM | POA: Insufficient documentation

## 2012-12-25 DIAGNOSIS — S6980XA Other specified injuries of unspecified wrist, hand and finger(s), initial encounter: Secondary | ICD-10-CM | POA: Insufficient documentation

## 2012-12-25 DIAGNOSIS — Z8669 Personal history of other diseases of the nervous system and sense organs: Secondary | ICD-10-CM | POA: Insufficient documentation

## 2012-12-25 LAB — GLUCOSE, CAPILLARY
Glucose-Capillary: 51 mg/dL — ABNORMAL LOW (ref 70–99)
Glucose-Capillary: 79 mg/dL (ref 70–99)
Glucose-Capillary: 107 mg/dL — ABNORMAL HIGH (ref 70–99)

## 2012-12-25 MED ORDER — OXYCODONE-ACETAMINOPHEN 5-325 MG PO TABS: 2.0000 | ORAL_TABLET | ORAL | Status: DC | PRN

## 2012-12-25 MED ORDER — MELOXICAM 15 MG PO TABS: 15.0000 mg | ORAL_TABLET | Freq: Every day | ORAL | Status: DC

## 2012-12-25 MED ORDER — KETOROLAC TROMETHAMINE 60 MG/2ML IM SOLN
60.0000 mg | Freq: Once | INTRAMUSCULAR | Status: AC
  Administered 2012-12-25: 60 mg via INTRAMUSCULAR
  Filled 2012-12-25: qty 2

## 2012-12-25 NOTE — ED Notes (Signed)
Returned from xray

## 2012-12-25 NOTE — ED Notes (Addendum)
CBG 51  Given orange juice.

## 2012-12-25 NOTE — ED Notes (Signed)
Pt states he is supposed to be on meds for DM, HTN and urinary problems but does not take them. States he has urinary frequency.

## 2012-12-25 NOTE — ED Notes (Signed)
Patient transported to X-ray 

## 2012-12-25 NOTE — Progress Notes (Signed)
Orthopedic Tech Progress Note Patient Details:  Dakota Stewart Jan 03, 1946 [id number]  Ortho Devices Type of Ortho Device: Velcro wrist splint Ortho Device/Splint Location: RIGHT VELCRO WRIST SPLINT Ortho Device/Splint Interventions: Application   Cammer, Mickie Bail 12/25/2012, 1:30 PM
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
## 2012-12-25 NOTE — ED Notes (Signed)
Gave pt orange juice, will check cbg after

## 2012-12-28 NOTE — ED Provider Notes (Signed)
Medical screening examination/treatment/procedure(s) were performed by non-physician practitioner and as supervising physician I was immediately available for consultation/collaboration.   Terron Merfeld, MD 12/28/12 1442 

## 2013-02-18 ENCOUNTER — Other Ambulatory Visit: Payer: Self-pay | Admitting: Ophthalmology
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
## 2013-02-24 ENCOUNTER — Encounter (HOSPITAL_COMMUNITY): Payer: Self-pay | Admitting: Anesthesiology

## 2013-02-24 ENCOUNTER — Encounter (HOSPITAL_COMMUNITY)
Admission: RE | Disposition: A | Discharge status: Other Acute Inpatient Hospital | Payer: Self-pay | Source: Ambulatory Visit | Attending: Ophthalmology

## 2013-02-24 ENCOUNTER — Encounter (HOSPITAL_COMMUNITY): Payer: Self-pay | Admitting: *Deleted

## 2013-02-24 ENCOUNTER — Observation Stay (HOSPITAL_COMMUNITY)
Admission: RE | Admit: 2013-02-24 | Discharge: 2013-02-25 | Payer: Medicare Other | Source: Ambulatory Visit | Attending: Ophthalmology | Admitting: Ophthalmology

## 2013-02-24 ENCOUNTER — Ambulatory Visit (HOSPITAL_COMMUNITY): Payer: Medicare Other | Admitting: Anesthesiology

## 2013-02-24 DIAGNOSIS — H269 Unspecified cataract: Secondary | ICD-10-CM | POA: Insufficient documentation

## 2013-02-24 DIAGNOSIS — X58XXXA Exposure to other specified factors, initial encounter: Secondary | ICD-10-CM | POA: Insufficient documentation

## 2013-02-24 DIAGNOSIS — I10 Essential (primary) hypertension: Secondary | ICD-10-CM | POA: Insufficient documentation

## 2013-02-24 DIAGNOSIS — E7800 Pure hypercholesterolemia, unspecified: Secondary | ICD-10-CM | POA: Insufficient documentation

## 2013-02-24 DIAGNOSIS — S0510XA Contusion of eyeball and orbital tissues, unspecified eye, initial encounter: Principal | ICD-10-CM | POA: Insufficient documentation

## 2013-02-24 HISTORY — PX: PARS PLANA VITRECTOMY: SHX2166

## 2013-02-24 LAB — CBC
HCT: 40.2 % (ref 39.0–52.0)
Hemoglobin: 14.8 g/dL (ref 13.0–17.0)
MCH: 33.4 pg (ref 26.0–34.0)
MCHC: 36.8 g/dL — ABNORMAL HIGH (ref 30.0–36.0)
MCV: 90.7 fL (ref 78.0–100.0)
Platelets: 200 10*3/uL (ref 150–400)
RBC: 4.43 MIL/uL (ref 4.22–5.81)
RDW: 13 % (ref 11.5–15.5)
WBC: 6.7 10*3/uL (ref 4.0–10.5)

## 2013-02-24 LAB — SURGICAL PCR SCREEN
MRSA, PCR: NEGATIVE
Staphylococcus aureus: NEGATIVE

## 2013-02-24 LAB — BASIC METABOLIC PANEL
Calcium: 8.9 mg/dL (ref 8.4–10.5)
Creatinine, Ser: 0.68 mg/dL (ref 0.50–1.35)
GFR calc Af Amer: >90 mL/min (ref >90)
GFR calc non Af Amer: >90 mL/min (ref >90)

## 2013-02-24 SURGERY — PARS PLANA VITRECTOMY WITH 23 GAUGE
Anesthesia: General | Site: Eye | Laterality: Left | Wound class: Clean

## 2013-02-24 MED ORDER — MIDAZOLAM HCL 5 MG/5ML IJ SOLN
INTRAMUSCULAR | Status: DC | PRN
  Administered 2013-02-24: .5 mg via INTRAVENOUS

## 2013-02-24 MED ORDER — PHENYLEPHRINE HCL 2.5 % OP SOLN
1.0000 [drp] | OPHTHALMIC | Status: AC | PRN
  Administered 2013-02-24 (×3): 1 [drp] via OPHTHALMIC
  Filled 2013-02-24: qty 3

## 2013-02-24 MED ORDER — DEXAMETHASONE SODIUM PHOSPHATE 10 MG/ML IJ SOLN
INTRAMUSCULAR | Status: DC | PRN
  Administered 2013-02-24: 10 mg

## 2013-02-24 MED ORDER — TETRACAINE HCL 0.5 % OP SOLN
1.0000 [drp] | OPHTHALMIC | Status: AC
  Administered 2013-02-24: 1 [drp] via OPHTHALMIC
  Filled 2013-02-24: qty 2

## 2013-02-24 MED ORDER — ROCURONIUM BROMIDE 100 MG/10ML IV SOLN
INTRAVENOUS | Status: DC | PRN
  Administered 2013-02-24 (×2): 10 mg via INTRAVENOUS
  Administered 2013-02-24: 50 mg via INTRAVENOUS

## 2013-02-24 MED ORDER — BSS IO SOLN
INTRAOCULAR | Status: DC | PRN
  Administered 2013-02-24: 15 mL via INTRAOCULAR

## 2013-02-24 MED ORDER — TRAMADOL HCL 50 MG PO TABS
50.0000 mg | ORAL_TABLET | ORAL | Status: DC | PRN
  Administered 2013-02-24 – 2013-02-25 (×4): 50 mg via ORAL
  Filled 2013-02-24 (×4): qty 1

## 2013-02-24 MED ORDER — PREDNISOLONE ACETATE 1 % OP SUSP
1.0000 [drp] | OPHTHALMIC | Status: AC
  Administered 2013-02-24: 1 [drp] via OPHTHALMIC
  Filled 2013-02-24: qty 5

## 2013-02-24 MED ORDER — HYPROMELLOSE (GONIOSCOPIC) 2.5 % OP SOLN
OPHTHALMIC | Status: AC
  Filled 2013-02-24: qty 15

## 2013-02-24 MED ORDER — DEXAMETHASONE SODIUM PHOSPHATE 10 MG/ML IJ SOLN
INTRAMUSCULAR | Status: AC
  Filled 2013-02-24: qty 1

## 2013-02-24 MED ORDER — ACETAZOLAMIDE SODIUM 500 MG IJ SOLR
INTRAMUSCULAR | Status: AC
  Filled 2013-02-24: qty 500

## 2013-02-24 MED ORDER — BSS IO SOLN
INTRAOCULAR | Status: AC
  Filled 2013-02-24: qty 15

## 2013-02-24 MED ORDER — SODIUM CHLORIDE 0.9 % IV SOLN
INTRAVENOUS | Status: DC
  Administered 2013-02-24: 21:00:00 via INTRAVENOUS

## 2013-02-24 MED ORDER — SODIUM CHLORIDE 0.9 % IV SOLN
INTRAVENOUS | Status: DC
  Administered 2013-02-24 (×2): via INTRAVENOUS
  Administered 2013-02-24: 35 mL/h via INTRAVENOUS

## 2013-02-24 MED ORDER — GATIFLOXACIN 0.5 % OP SOLN
1.0000 [drp] | OPHTHALMIC | Status: AC | PRN
  Administered 2013-02-24 (×3): 1 [drp] via OPHTHALMIC
  Filled 2013-02-24: qty 2.5

## 2013-02-24 MED ORDER — BACITRACIN-POLYMYXIN B 500-10000 UNIT/GM OP OINT
TOPICAL_OINTMENT | OPHTHALMIC | Status: AC
  Filled 2013-02-24: qty 3.5

## 2013-02-24 MED ORDER — ACETAMINOPHEN 10 MG/ML IV SOLN: 1000.0000 mg | Freq: Once | INTRAVENOUS | Status: DC | PRN

## 2013-02-24 MED ORDER — BUPIVACAINE HCL (PF) 0.75 % IJ SOLN
INTRAMUSCULAR | Status: AC
  Filled 2013-02-24: qty 10

## 2013-02-24 MED ORDER — CEFAZOLIN SUBCONJUNCTIVAL INJECTION 100 MG/0.5 ML
200.0000 mg | INJECTION | SUBCONJUNCTIVAL | Status: AC
  Administered 2013-02-24: 200 mg via SUBCONJUNCTIVAL
  Filled 2013-02-24: qty 1

## 2013-02-24 MED ORDER — FENTANYL CITRATE 0.05 MG/ML IJ SOLN
INTRAMUSCULAR | Status: DC | PRN
  Administered 2013-02-24: 100 ug via INTRAVENOUS
  Administered 2013-02-24 (×2): 50 ug via INTRAVENOUS

## 2013-02-24 MED ORDER — ONDANSETRON HCL 4 MG/2ML IJ SOLN
INTRAMUSCULAR | Status: DC | PRN
  Administered 2013-02-24: 4 mg via INTRAVENOUS

## 2013-02-24 MED ORDER — LIDOCAINE HCL (CARDIAC) 20 MG/ML IV SOLN
INTRAVENOUS | Status: DC | PRN
  Administered 2013-02-24: 30 mg via INTRAVENOUS
  Administered 2013-02-24: 70 mg via INTRAVENOUS

## 2013-02-24 MED ORDER — MUPIROCIN 2 % EX OINT
TOPICAL_OINTMENT | Freq: Two times a day (BID) | CUTANEOUS | Status: DC
  Administered 2013-02-24: 1 via NASAL
  Filled 2013-02-24: qty 22

## 2013-02-24 MED ORDER — EPINEPHRINE HCL 1 MG/ML IJ SOLN
INTRAMUSCULAR | Status: AC
  Filled 2013-02-24: qty 1

## 2013-02-24 MED ORDER — PROPOFOL 10 MG/ML IV BOLUS
INTRAVENOUS | Status: DC | PRN
  Administered 2013-02-24: 150 mg via INTRAVENOUS
  Administered 2013-02-24: 50 mg via INTRAVENOUS

## 2013-02-24 MED ORDER — BSS PLUS IO SOLN
INTRAOCULAR | Status: AC
  Filled 2013-02-24: qty 500

## 2013-02-24 MED ORDER — HYPROMELLOSE (GONIOSCOPIC) 2.5 % OP SOLN
OPHTHALMIC | Status: DC | PRN
  Administered 2013-02-24: 1 [drp] via OPHTHALMIC

## 2013-02-24 MED ORDER — BUPIVACAINE HCL (PF) 0.5 % IJ SOLN
INTRAMUSCULAR | Status: AC
  Filled 2013-02-24: qty 30

## 2013-02-24 MED ORDER — BUPIVACAINE HCL (PF) 0.75 % IJ SOLN
INTRAMUSCULAR | Status: DC | PRN
  Administered 2013-02-24: 10 mL

## 2013-02-24 MED ORDER — BACITRACIN-POLYMYXIN B 500-10000 UNIT/GM OP OINT
TOPICAL_OINTMENT | OPHTHALMIC | Status: DC | PRN
  Administered 2013-02-24: 1 via OPHTHALMIC

## 2013-02-24 MED ORDER — HYDROMORPHONE HCL PF 1 MG/ML IJ SOLN: 0.2500 mg | INTRAMUSCULAR | Status: DC | PRN

## 2013-02-24 MED ORDER — ONDANSETRON HCL 4 MG/2ML IJ SOLN: 4.0000 mg | Freq: Once | INTRAMUSCULAR | Status: DC | PRN

## 2013-02-24 MED ORDER — EPINEPHRINE HCL 1 MG/ML IJ SOLN
INTRAOCULAR | Status: DC | PRN
  Administered 2013-02-24: 12:00:00

## 2013-02-24 MED ORDER — LIDOCAINE HCL 2 % IJ SOLN
INTRAMUSCULAR | Status: AC
  Filled 2013-02-24: qty 20

## 2013-02-24 SURGICAL SUPPLY — 83 items
ACCESSORY FRAGMATOME (MISCELLANEOUS) ×2 IMPLANT
APPLICATOR COTTON TIP 6IN STRL (MISCELLANEOUS) ×2 IMPLANT
APPLICATOR DR MATTHEWS STRL (MISCELLANEOUS) ×2 IMPLANT
BAG MINI COLL DRAIN (WOUND CARE) ×2 IMPLANT
BLADE EYE MINI 60D BEAVER (BLADE) ×2 IMPLANT
BLADE KERATOME 2.75 (BLADE) ×2 IMPLANT
BLADE MVR KNIFE 19G (BLADE) IMPLANT
BLADE MVR KNIFE 20G (BLADE) ×2 IMPLANT
BLADE STAB KNIFE 15DEG (BLADE) ×2 IMPLANT
CANNULA DUAL BORE 23G (CANNULA) IMPLANT
CANNULA FLEX TIP 23G (CANNULA) IMPLANT
CLOTH BEACON ORANGE TIMEOUT ST (SAFETY) ×2 IMPLANT
CORDS BIPOLAR (ELECTRODE) ×2 IMPLANT
DRAPE OPHTHALMIC 77X100 STRL (CUSTOM PROCEDURE TRAY) ×2 IMPLANT
DRAPE POUCH INSTRU U-SHP 10X18 (DRAPES) ×2 IMPLANT
DRSG TEGADERM 4X4.75 (GAUZE/BANDAGES/DRESSINGS) ×2 IMPLANT
ERASER HMR WETFIELD 23G BP (MISCELLANEOUS) IMPLANT
FILTER BLUE MILLIPORE (MISCELLANEOUS) IMPLANT
GAS OPHTHALMIC (MISCELLANEOUS) IMPLANT
GLOVE SS BIOGEL STRL SZ 6.5 (GLOVE) ×1 IMPLANT
GLOVE SS BIOGEL STRL SZ 7 (GLOVE) ×1 IMPLANT
GLOVE SUPERSENSE BIOGEL SZ 6.5 (GLOVE) ×1
GLOVE SUPERSENSE BIOGEL SZ 7 (GLOVE) ×1
GLOVE SURG SS PI 6.0 STRL IVOR (GLOVE) ×2 IMPLANT
GLOVE SURG SS PI 6.5 STRL IVOR (GLOVE) ×2 IMPLANT
GOWN SRG XL XLNG 56XLVL 4 (GOWN DISPOSABLE) ×1 IMPLANT
GOWN STRL NON-REIN LRG LVL3 (GOWN DISPOSABLE) ×4 IMPLANT
GOWN STRL NON-REIN XL XLG LVL4 (GOWN DISPOSABLE) ×1
ILLUMINATOR WIDEFIELD DIFF (MISCELLANEOUS) ×2 IMPLANT
KIT BASIN OR (CUSTOM PROCEDURE TRAY) ×2 IMPLANT
KIT ROOM TURNOVER OR (KITS) ×2 IMPLANT
KNIFE CRESCENT 1.75 EDGEAHEAD (BLADE) IMPLANT
KNIFE GRIESHABER SHARP 2.5MM (MISCELLANEOUS) ×2 IMPLANT
LENS IOL POST 1PIECE DIOP 21.5 (Intraocular Lens) ×2 IMPLANT
MARKER SKIN DUAL TIP RULER LAB (MISCELLANEOUS) IMPLANT
MASK EYE SHIELD (GAUZE/BANDAGES/DRESSINGS) ×2 IMPLANT
NEEDLE 18GX1X1/2 (RX/OR ONLY) (NEEDLE) ×2 IMPLANT
NEEDLE 22X1 1/2 (OR ONLY) (NEEDLE) ×2 IMPLANT
NEEDLE 25GX 5/8IN NON SAFETY (NEEDLE) ×2 IMPLANT
NEEDLE 27GX1/2 REG BEVEL ECLIP (NEEDLE) ×2 IMPLANT
NEEDLE FILTER BLUNT 18X 1/2SAF (NEEDLE) ×1
NEEDLE FILTER BLUNT 18X1 1/2 (NEEDLE) ×1 IMPLANT
NEEDLE HYPO 30X.5 LL (NEEDLE) ×6 IMPLANT
NS IRRIG 1000ML POUR BTL (IV SOLUTION) ×2 IMPLANT
PACK COMBINED CATERACT/VIT 23G (OPHTHALMIC RELATED) IMPLANT
PACK VITRECTOMY CUSTOM (CUSTOM PROCEDURE TRAY) ×2 IMPLANT
PACK VITRECTOMY PIC MCHSVP (PACKS) IMPLANT
PAD ARMBOARD 7.5X6 YLW CONV (MISCELLANEOUS) ×4 IMPLANT
PAD EYE OVAL STERILE LF (GAUZE/BANDAGES/DRESSINGS) ×2 IMPLANT
PAK VITRECTOMY PIK  23GA (OPHTHALMIC RELATED) ×2 IMPLANT
PENCIL BIPOLAR 25GA STR DISP (OPHTHALMIC RELATED) IMPLANT
PHACO TIP KELMAN 45DEG (TIP) IMPLANT
PROBE DIRECTIONAL LASER (MISCELLANEOUS) ×2 IMPLANT
RETRACTOR IRIS FLEX 25G GRIESH (INSTRUMENTS) ×2 IMPLANT
ROLLS DENTAL (MISCELLANEOUS) ×4 IMPLANT
SCRAPER DIAMOND DUST MEMBRANE (MISCELLANEOUS) IMPLANT
SET FLUID INJECTOR (SET/KITS/TRAYS/PACK) IMPLANT
SET VGFI TUBING 8065808002 (SET/KITS/TRAYS/PACK) IMPLANT
SHUTTLE MONARCH TYPE A (NEEDLE) ×2 IMPLANT
SOLUTION ANTI FOG 6CC (MISCELLANEOUS) IMPLANT
SPEAR EYE SURG WECK-CEL (MISCELLANEOUS) ×6 IMPLANT
SUT ETHILON 10 0 CS140 6 (SUTURE) IMPLANT
SUT ETHILON 10-0 CS-B-6CS-B-6 (SUTURE) ×2
SUT ETHILON 4 0 P 3 18 (SUTURE) IMPLANT
SUT ETHILON 5 0 P 3 18 (SUTURE)
SUT ETHILON 9 0 TG140 8 (SUTURE) IMPLANT
SUT NYLON 10.0 BLK (SUTURE) IMPLANT
SUT NYLON ETHILON 5-0 P-3 1X18 (SUTURE) IMPLANT
SUT PLAIN 6 0 TG1408 (SUTURE) ×2 IMPLANT
SUT POLY NON ABSORB 10-0 8 STR (SUTURE) ×4 IMPLANT
SUT VICRYL 7 0 TG140 8 (SUTURE) IMPLANT
SUT VICRYL ABS 6-0 S29 18IN (SUTURE) IMPLANT
SUTURE EHLN 10-0 CS-B-6CS-B-6 (SUTURE) ×1 IMPLANT
SYR 20CC LL (SYRINGE) ×2 IMPLANT
SYR 5ML LL (SYRINGE) IMPLANT
SYR TB 1ML LUER SLIP (SYRINGE) ×2 IMPLANT
SYRINGE 10CC LL (SYRINGE) IMPLANT
TAPE PAPER MEDFIX 1IN X 10YD (GAUZE/BANDAGES/DRESSINGS) ×2 IMPLANT
TOWEL OR 17X24 6PK STRL BLUE (TOWEL DISPOSABLE) ×4 IMPLANT
TUBE EXTENSION HAMMER (TUBING) IMPLANT
WATER STERILE IRR 1000ML POUR (IV SOLUTION) ×2 IMPLANT
WIPE INSTRUMENT ADHESIVE BACK (MISCELLANEOUS) ×2 IMPLANT
WIPE INSTRUMENT VISIWIPE 73X73 (MISCELLANEOUS) ×2 IMPLANT

## 2013-02-24 NOTE — Anesthesia Postprocedure Evaluation (Signed)
Anesthesia Post Note  Patient: Dakota Stewart  Procedure(s) Performed: Procedure(s) (LRB): PARS PLANA VITRECTOMY WITH 23 GAUGE; PARS PLANA LENSECTOMY, SUTURED INTROCULAR LENS OS (Left)  Anesthesia type: general  Patient location: PACU  Post pain: Pain level controlled  Post assessment: Patient's Cardiovascular Status Stable  Last Vitals:  Filed Vitals:   02/24/13 1600  BP: 128/90  Pulse: 66  Temp:   Resp: 16    Post vital signs: Reviewed and stable  Level of consciousness: sedated  Complications: No apparent anesthesia complications

## 2013-02-24 NOTE — Op Note (Signed)
BURMAN BRUINGTON 02/24/2013 Diagnosis: Dislocated Cataractous Lens Left Eye  Procedure: Pars Plana Vitrectomy and Pars Plana Lensectomy with sutured secondary PC IOL Left Eye Operative Eye:  left eye  Surgeon: Shade Flood Estimated Blood Loss: minimal Specimens for Pathology:  None Complications: none   The  patient was prepped and draped in the usual fashion for ocular surgery on the  left eye .  A solid lid speculum was placed. The conjunctiva was displaced with a cotton tipped applicator at the  4:30  meridian. A trocar/cannula was placed 3.5 mm from the surgical limbus. The cannula was visualized in the vitreous cavity. The infusion line was allowed to run and then clamped when placed at the cannula opening. The line was inserted and secured to the drape with a steri-strip. Conjunctival peritomies were performed at the 9:00 and 2:00 meridians.  The conjunctiva was reflected inferiorly on both sides of the eye. The sclera was cleaned and cauterized. The cautery was used to mark the sclera for medial and temporal scleral dissection of flaps. A trocar/cannula was placed at the 2:30 meridian and an MVR blade was used to make a sclerotomy at the 9;30 meridian. The vitectomy cutter was placed through the sclerotomy and core vitrectomy was carried out. Remaining zonules attached to the dislocated lens were severed. The vitreous was removed up to the vitreous base for 360 degrees. The Fragmatome was then used to remove the dislocated lens without complication. The viterous cutter was used to remove any small lens fragments which remained.   The Greishaber 01 blade was used to do scleral groves demarcating a triangular flap nasal and temporal to the clear cornea. A Beaver 6600 blade was used to dissect a thick scleral flap on each side. A 27 g needle was placed through the dissected bed behind the iris plane. A straight 10-0 needle with Prolene suture was placed through the scleral bed on the opposite  side.The tip of the needle was threaded into the 27g needle which was used to guide the needle out the opposite side of the eye. The same maneuver was repeated placing a second suture at the opposite side of the scleral bed. A superior corneal groove was placed using a Greishaber 01 blade. The keratome was used to make a superior clear corneal incision for 7mm. The Kuglin hook was then used to retrive the most superior prolene suture. It was externalized and cut in half. Each half was tied to the eyelet of the CZ +21.50 PC IOL. The second suture was retrieved in similar fashion, cut and also tied to each haptic. The lens was then placed through the corneal incision into the vitreous cavity. The sutures were gradually brought up to maneuver the lens into position in the sulcus. The assistant reflected the scleral flap and the sutures were tied and knotted on both sides. The lens wa swell centered and stabel at the close of the procedure.  The scleral flap was sutured closed with 7-0 vicryl suture. The sclerotomies were closed with 7-0 vicryl suture ( after removal of the cannula at 2:30). The conjunctiva was re approximated with 6-0 Plain Gut suture. The infusion line was removed with an ocular pressure of . The corneal incision was closed with 4 10-0 nylon sutures.   Subconjunctival injections of Ancef 100mg /0.30ml and Dexamethasone 4mg /74ml were placed in the infero-medial quadrant to avoid proximity to the cannula sites.   The infusion cannula was removed with concomitant tamponade with the cotton tipped applicator leaving the  ocular pressure less than 10 by palpation.  The speculum and drapes were removed and the eye was patched with Polymixin/Bacitracin ophthalmic ointment. An eye shield was placed and the patient was uneventfully extubated and transferred with stable vital signs to the post operative recovery area.  Shade Flood MD

## 2013-02-24 NOTE — Progress Notes (Addendum)
Pt did not want to keep HOB at 45 degrees. Stated that having the head of bed elevated that high would make him start coughing. Agreeable to keep head of bed somewhat elevated. Stated he would leave if he had to sleep with head elevated at 45 degrees.

## 2013-02-24 NOTE — Transfer of Care (Signed)
Immediate Anesthesia Transfer of Care Note  Patient: Dakota Stewart  Procedure(s) Performed: Procedure(s): PARS PLANA VITRECTOMY WITH 23 GAUGE; PARS PLANA LENSECTOMY, SUTURED INTROCULAR LENS OS (Left)  Patient Location: PACU  Anesthesia Type:General  Level of Consciousness: awake, alert  and oriented  Airway & Oxygen Therapy: Patient Spontanous Breathing and Patient connected to nasal cannula oxygen  Post-op Assessment: Report given to PACU RN and Post -op Vital signs reviewed and stable  Post vital signs: Reviewed and stable  Complications: No apparent anesthesia complications
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
## 2013-02-24 NOTE — Preoperative (Signed)
Beta Blockers   Reason not to administer Beta Blockers:Not Applicable 
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
## 2013-02-24 NOTE — Interval H&P Note (Signed)
History and Physical Interval Note:  02/24/2013 10:21 AM  Dakota Stewart  has presented today for surgery, with the diagnosis of Dislocated Cataractous Lens Left Eye  The various methods of treatment have been discussed with the patient and family. After consideration of risks, benefits and other options for treatment, the patient has consented to  Procedure(s): PARS PLANA VITRECTOMY WITH 23 GAUGE, Pars Plana Lensectomy, sutured intraocualr lens OS (Left) as a surgical intervention .  The patient's history has been reviewed, patient examined, no change in status, stable for surgery.  I have reviewed the patient's chart and labs.  Questions were answered to the patient's satisfaction.     Prinston Kynard, Waynette Buttery

## 2013-02-25 MED ORDER — GATIFLOXACIN 0.5 % OP SOLN
1.0000 [drp] | Freq: Four times a day (QID) | OPHTHALMIC | Status: DC
  Filled 2013-02-25 (×2): qty 2.5

## 2013-02-25 NOTE — Progress Notes (Signed)
Since discussion with pt regarding HOB being at 45 degrees, pt has kept HOB at 45 degrees.

## 2013-02-25 NOTE — Discharge Summary (Signed)
DISCHARGE SUMMARY Dakota Stewart  02/25/2013  Post surgery for dislocated natural lens left eye.    Medication List     As of 02/25/2013  6:38 PM    Notice      You have not been prescribed any medications.             Follow-up Information   Follow up with Shade Flood, MD In 1 day. (4:20PM)    Contact information:   [address] [place], [place] [phone]       Willadeen Colantuono, Hinton Lovely

## 2013-02-26 ENCOUNTER — Encounter (HOSPITAL_COMMUNITY): Payer: Self-pay | Admitting: Ophthalmology

## 2013-02-26 ENCOUNTER — Emergency Department (HOSPITAL_COMMUNITY)
Admission: EM | Admit: 2013-02-26 | Discharge: 2013-02-26 | Disposition: A | Discharge status: Home / Self Care | Payer: Medicare Other | Attending: Emergency Medicine | Admitting: Emergency Medicine

## 2013-02-26 ENCOUNTER — Emergency Department (HOSPITAL_COMMUNITY): Payer: Medicare Other

## 2013-02-26 DIAGNOSIS — Z8679 Personal history of other diseases of the circulatory system: Secondary | ICD-10-CM | POA: Insufficient documentation

## 2013-02-26 DIAGNOSIS — S058X9A Other injuries of unspecified eye and orbit, initial encounter: Secondary | ICD-10-CM | POA: Insufficient documentation

## 2013-02-26 DIAGNOSIS — Y838 Other surgical procedures as the cause of abnormal reaction of the patient, or of later complication, without mention of misadventure at the time of the procedure: Secondary | ICD-10-CM | POA: Insufficient documentation

## 2013-02-26 DIAGNOSIS — I10 Essential (primary) hypertension: Secondary | ICD-10-CM | POA: Insufficient documentation

## 2013-02-26 DIAGNOSIS — Z9181 History of falling: Secondary | ICD-10-CM | POA: Insufficient documentation

## 2013-02-26 DIAGNOSIS — S0502XD Injury of conjunctiva and corneal abrasion without foreign body, left eye, subsequent encounter: Secondary | ICD-10-CM

## 2013-02-26 DIAGNOSIS — Z8701 Personal history of pneumonia (recurrent): Secondary | ICD-10-CM | POA: Insufficient documentation

## 2013-02-26 DIAGNOSIS — H53149 Visual discomfort, unspecified: Secondary | ICD-10-CM | POA: Insufficient documentation

## 2013-02-26 DIAGNOSIS — M129 Arthropathy, unspecified: Secondary | ICD-10-CM | POA: Insufficient documentation

## 2013-02-26 DIAGNOSIS — R51 Headache: Secondary | ICD-10-CM | POA: Insufficient documentation

## 2013-02-26 DIAGNOSIS — R110 Nausea: Secondary | ICD-10-CM | POA: Insufficient documentation

## 2013-02-26 DIAGNOSIS — Z862 Personal history of diseases of the blood and blood-forming organs and certain disorders involving the immune mechanism: Secondary | ICD-10-CM | POA: Insufficient documentation

## 2013-02-26 DIAGNOSIS — Z8659 Personal history of other mental and behavioral disorders: Secondary | ICD-10-CM | POA: Insufficient documentation

## 2013-02-26 DIAGNOSIS — F1010 Alcohol abuse, uncomplicated: Secondary | ICD-10-CM | POA: Insufficient documentation

## 2013-02-26 DIAGNOSIS — Z8639 Personal history of other endocrine, nutritional and metabolic disease: Secondary | ICD-10-CM | POA: Insufficient documentation

## 2013-02-26 DIAGNOSIS — F17200 Nicotine dependence, unspecified, uncomplicated: Secondary | ICD-10-CM | POA: Insufficient documentation

## 2013-02-26 DIAGNOSIS — Z8619 Personal history of other infectious and parasitic diseases: Secondary | ICD-10-CM | POA: Insufficient documentation

## 2013-02-26 DIAGNOSIS — R079 Chest pain, unspecified: Secondary | ICD-10-CM | POA: Insufficient documentation

## 2013-02-26 LAB — POCT I-STAT TROPONIN I: Troponin i, poc: 0 ng/mL (ref 0.00–0.08)

## 2013-02-26 LAB — CBC
HCT: 42.4 % (ref 39.0–52.0)
Hemoglobin: 15.5 g/dL (ref 13.0–17.0)
MCH: 32.8 pg (ref 26.0–34.0)
MCHC: 36.6 g/dL — ABNORMAL HIGH (ref 30.0–36.0)

## 2013-02-26 LAB — BASIC METABOLIC PANEL
Calcium: 9.7 mg/dL (ref 8.4–10.5)
GFR calc Af Amer: >90 mL/min (ref >90)
GFR calc non Af Amer: >90 mL/min (ref >90)
Sodium: 136 mEq/L (ref 135–145)

## 2013-02-26 MED ORDER — HYDROCODONE-ACETAMINOPHEN 5-325 MG PO TABS: 2.0000 | ORAL_TABLET | ORAL | Status: DC | PRN

## 2013-02-26 NOTE — ED Notes (Signed)
Pt presents with increased L eye pain since surgery x 2 days ago.  Pt also reports L sided chest pain that began last night while pt was napping, waking him up.  Pt reports pain has been constant and has worsened today.  Pain radiates into L scapula. +nausea.
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
## 2013-02-27 ENCOUNTER — Encounter (HOSPITAL_COMMUNITY): Payer: Self-pay | Admitting: *Deleted

## 2013-02-27 ENCOUNTER — Emergency Department (HOSPITAL_COMMUNITY)
Admission: EM | Admit: 2013-02-27 | Discharge: 2013-02-27 | Disposition: A | Discharge status: Home / Self Care | Payer: Medicare Other | Attending: Emergency Medicine | Admitting: Emergency Medicine

## 2013-02-27 DIAGNOSIS — E119 Type 2 diabetes mellitus without complications: Secondary | ICD-10-CM | POA: Insufficient documentation

## 2013-02-27 DIAGNOSIS — Z8679 Personal history of other diseases of the circulatory system: Secondary | ICD-10-CM | POA: Insufficient documentation

## 2013-02-27 DIAGNOSIS — Z8659 Personal history of other mental and behavioral disorders: Secondary | ICD-10-CM | POA: Insufficient documentation

## 2013-02-27 DIAGNOSIS — Z8639 Personal history of other endocrine, nutritional and metabolic disease: Secondary | ICD-10-CM | POA: Insufficient documentation

## 2013-02-27 DIAGNOSIS — Z87448 Personal history of other diseases of urinary system: Secondary | ICD-10-CM | POA: Insufficient documentation

## 2013-02-27 DIAGNOSIS — I10 Essential (primary) hypertension: Secondary | ICD-10-CM | POA: Insufficient documentation

## 2013-02-27 DIAGNOSIS — W19XXXA Unspecified fall, initial encounter: Secondary | ICD-10-CM

## 2013-02-27 DIAGNOSIS — Z8739 Personal history of other diseases of the musculoskeletal system and connective tissue: Secondary | ICD-10-CM | POA: Insufficient documentation

## 2013-02-27 DIAGNOSIS — F17200 Nicotine dependence, unspecified, uncomplicated: Secondary | ICD-10-CM | POA: Insufficient documentation

## 2013-02-27 DIAGNOSIS — M79604 Pain in right leg: Secondary | ICD-10-CM

## 2013-02-27 DIAGNOSIS — Y92410 Unspecified street and highway as the place of occurrence of the external cause: Secondary | ICD-10-CM | POA: Insufficient documentation

## 2013-02-27 DIAGNOSIS — Z9181 History of falling: Secondary | ICD-10-CM | POA: Insufficient documentation

## 2013-02-27 DIAGNOSIS — Z8619 Personal history of other infectious and parasitic diseases: Secondary | ICD-10-CM | POA: Insufficient documentation

## 2013-02-27 DIAGNOSIS — Z862 Personal history of diseases of the blood and blood-forming organs and certain disorders involving the immune mechanism: Secondary | ICD-10-CM | POA: Insufficient documentation

## 2013-02-27 DIAGNOSIS — F1010 Alcohol abuse, uncomplicated: Secondary | ICD-10-CM | POA: Insufficient documentation

## 2013-02-27 DIAGNOSIS — Z8701 Personal history of pneumonia (recurrent): Secondary | ICD-10-CM | POA: Insufficient documentation

## 2013-02-27 DIAGNOSIS — Z87828 Personal history of other (healed) physical injury and trauma: Secondary | ICD-10-CM | POA: Insufficient documentation

## 2013-02-27 DIAGNOSIS — F10929 Alcohol use, unspecified with intoxication, unspecified: Secondary | ICD-10-CM

## 2013-02-27 DIAGNOSIS — Y9389 Activity, other specified: Secondary | ICD-10-CM | POA: Insufficient documentation

## 2013-02-27 HISTORY — DX: Type 2 diabetes mellitus without complications: E11.9

## 2013-02-27 MED ORDER — IBUPROFEN 800 MG PO TABS: 800.0000 mg | ORAL_TABLET | Freq: Once | ORAL | Status: DC

## 2013-02-27 NOTE — ED Notes (Signed)
Pt at nurses station stating he wants to leave.  Pt upset he has not rec'd pain meds.  This RN transporting pt to OR.  Explained to pt why the delay and apologized.  Advised pt that if he went back to his room I could get his meds.  Pt refuses.  Dr. Silverio Lay notified that pt is leaving AMA.  Dr Silverio Lay advised that pt is to be taken to jail when d/c'd.  Security and [employer] notified.  [employer] states if needed to be arrested, staff from [employer] should have already come and pt can leave if he wants.  Dr Silverio Lay advised and pt d/c'd.  Now family with pt and they want pt seen.  Family advised that pt has been d/c'd and they must register again if treatment needed.
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
## 2013-08-29 ENCOUNTER — Encounter (HOSPITAL_COMMUNITY): Payer: Self-pay | Admitting: *Deleted

## 2013-08-29 ENCOUNTER — Emergency Department (HOSPITAL_COMMUNITY)
Admission: EM | Admit: 2013-08-29 | Discharge: 2013-08-29 | Discharge status: Against Medical Advice | Payer: Medicare Other | Attending: Emergency Medicine | Admitting: Emergency Medicine

## 2013-08-29 DIAGNOSIS — Z8701 Personal history of pneumonia (recurrent): Secondary | ICD-10-CM | POA: Insufficient documentation

## 2013-08-29 DIAGNOSIS — Z862 Personal history of diseases of the blood and blood-forming organs and certain disorders involving the immune mechanism: Secondary | ICD-10-CM | POA: Insufficient documentation

## 2013-08-29 DIAGNOSIS — Z8739 Personal history of other diseases of the musculoskeletal system and connective tissue: Secondary | ICD-10-CM | POA: Insufficient documentation

## 2013-08-29 DIAGNOSIS — Z8719 Personal history of other diseases of the digestive system: Secondary | ICD-10-CM | POA: Insufficient documentation

## 2013-08-29 DIAGNOSIS — F17200 Nicotine dependence, unspecified, uncomplicated: Secondary | ICD-10-CM | POA: Insufficient documentation

## 2013-08-29 DIAGNOSIS — R55 Syncope and collapse: Secondary | ICD-10-CM | POA: Insufficient documentation

## 2013-08-29 DIAGNOSIS — E119 Type 2 diabetes mellitus without complications: Secondary | ICD-10-CM | POA: Insufficient documentation

## 2013-08-29 DIAGNOSIS — G8929 Other chronic pain: Secondary | ICD-10-CM | POA: Insufficient documentation

## 2013-08-29 DIAGNOSIS — I10 Essential (primary) hypertension: Secondary | ICD-10-CM | POA: Insufficient documentation

## 2013-08-29 DIAGNOSIS — Z8659 Personal history of other mental and behavioral disorders: Secondary | ICD-10-CM | POA: Insufficient documentation

## 2013-08-29 DIAGNOSIS — Z8639 Personal history of other endocrine, nutritional and metabolic disease: Secondary | ICD-10-CM | POA: Insufficient documentation

## 2013-08-29 NOTE — ED Notes (Signed)
Pt refusing to let staff assess him. MD Rhunette Croft spoke with patient, still requesting to leave ama. Brought pt out to hall to have him sign ama paperwork, pt states "I'm not signing myself out, I'm leaving". Pt then walked out of the hospital by himself.

## 2013-08-29 NOTE — ED Notes (Signed)
Pt upset that he is here, states he is going to leave. MD aware
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
## 2013-08-29 NOTE — ED Notes (Signed)
Bed: WA17 Expected date:  Expected time:  Means of arrival:  Comments: Syncopal episode
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
## 2013-09-13 ENCOUNTER — Emergency Department (HOSPITAL_COMMUNITY)
Admission: EM | Admit: 2013-09-13 | Discharge: 2013-09-13 | Disposition: A | Discharge status: Home / Self Care | Payer: Medicare Other | Source: Home / Self Care | Attending: Emergency Medicine | Admitting: Emergency Medicine

## 2013-09-13 ENCOUNTER — Emergency Department (HOSPITAL_COMMUNITY): Payer: Medicare Other

## 2013-09-13 ENCOUNTER — Encounter (HOSPITAL_COMMUNITY): Payer: Self-pay | Admitting: Emergency Medicine

## 2013-09-13 DIAGNOSIS — R209 Unspecified disturbances of skin sensation: Secondary | ICD-10-CM | POA: Insufficient documentation

## 2013-09-13 DIAGNOSIS — G8929 Other chronic pain: Secondary | ICD-10-CM | POA: Insufficient documentation

## 2013-09-13 DIAGNOSIS — R42 Dizziness and giddiness: Secondary | ICD-10-CM | POA: Insufficient documentation

## 2013-09-13 DIAGNOSIS — I10 Essential (primary) hypertension: Secondary | ICD-10-CM

## 2013-09-13 DIAGNOSIS — M129 Arthropathy, unspecified: Secondary | ICD-10-CM | POA: Insufficient documentation

## 2013-09-13 DIAGNOSIS — Z9189 Other specified personal risk factors, not elsewhere classified: Secondary | ICD-10-CM | POA: Insufficient documentation

## 2013-09-13 DIAGNOSIS — Z8669 Personal history of other diseases of the nervous system and sense organs: Secondary | ICD-10-CM | POA: Insufficient documentation

## 2013-09-13 DIAGNOSIS — R51 Headache: Secondary | ICD-10-CM | POA: Insufficient documentation

## 2013-09-13 DIAGNOSIS — E039 Hypothyroidism, unspecified: Secondary | ICD-10-CM | POA: Insufficient documentation

## 2013-09-13 DIAGNOSIS — Z8719 Personal history of other diseases of the digestive system: Secondary | ICD-10-CM | POA: Insufficient documentation

## 2013-09-13 DIAGNOSIS — F329 Major depressive disorder, single episode, unspecified: Secondary | ICD-10-CM | POA: Insufficient documentation

## 2013-09-13 DIAGNOSIS — Z8639 Personal history of other endocrine, nutritional and metabolic disease: Secondary | ICD-10-CM | POA: Insufficient documentation

## 2013-09-13 DIAGNOSIS — F1021 Alcohol dependence, in remission: Secondary | ICD-10-CM | POA: Insufficient documentation

## 2013-09-13 DIAGNOSIS — Z862 Personal history of diseases of the blood and blood-forming organs and certain disorders involving the immune mechanism: Secondary | ICD-10-CM | POA: Insufficient documentation

## 2013-09-13 DIAGNOSIS — Z87891 Personal history of nicotine dependence: Secondary | ICD-10-CM | POA: Insufficient documentation

## 2013-09-13 DIAGNOSIS — M549 Dorsalgia, unspecified: Secondary | ICD-10-CM | POA: Insufficient documentation

## 2013-09-13 DIAGNOSIS — F3289 Other specified depressive episodes: Secondary | ICD-10-CM | POA: Insufficient documentation

## 2013-09-13 DIAGNOSIS — R202 Paresthesia of skin: Secondary | ICD-10-CM

## 2013-09-13 DIAGNOSIS — E119 Type 2 diabetes mellitus without complications: Secondary | ICD-10-CM | POA: Insufficient documentation

## 2013-09-13 LAB — COMPREHENSIVE METABOLIC PANEL
Albumin: 4 g/dL (ref 3.5–5.2)
Alkaline Phosphatase: 93 U/L (ref 39–117)
BUN: 5 mg/dL — ABNORMAL LOW (ref 6–23)
Potassium: 3.7 mEq/L (ref 3.5–5.1)
Sodium: 135 mEq/L (ref 135–145)
Total Protein: 8.5 g/dL — ABNORMAL HIGH (ref 6.0–8.3)

## 2013-09-13 LAB — TROPONIN I: Troponin I: <0.3 ng/mL (ref <0.30)

## 2013-09-13 LAB — CBC
MCHC: 37.8 g/dL — ABNORMAL HIGH (ref 30.0–36.0)
Platelets: 229 10*3/uL (ref 150–400)
RDW: 12.7 % (ref 11.5–15.5)

## 2013-09-13 MED ORDER — TRAMADOL HCL 50 MG PO TABS: 50.0000 mg | ORAL_TABLET | Freq: Four times a day (QID) | ORAL | Status: DC | PRN

## 2013-09-13 MED ORDER — CETIRIZINE-PSEUDOEPHEDRINE ER 5-120 MG PO TB12: 1.0000 | ORAL_TABLET | Freq: Two times a day (BID) | ORAL | Status: DC | PRN

## 2013-09-13 MED ORDER — TRAMADOL HCL 50 MG PO TABS
50.0000 mg | ORAL_TABLET | Freq: Once | ORAL | Status: AC
  Administered 2013-09-13: 50 mg via ORAL
  Filled 2013-09-13: qty 1

## 2013-09-13 MED ORDER — HYDROCODONE-ACETAMINOPHEN 5-325 MG PO TABS
2.0000 | ORAL_TABLET | Freq: Once | ORAL | Status: AC
  Administered 2013-09-13: 2 via ORAL
  Filled 2013-09-13: qty 2

## 2013-09-13 NOTE — ED Notes (Signed)
Patient transported to CT 

## 2013-09-13 NOTE — ED Notes (Signed)
Pt irritable, does not want to cooperate with nursing assessment. "just get me something for pain now. I'm hurting"  Was able to perform assessment under some duress.
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
## 2013-09-13 NOTE — ED Notes (Signed)
Pt states he woke up last night at 1130pm and the entire left side of face to body; left leg feels "funny" sometimes.

## 2013-09-14 ENCOUNTER — Inpatient Hospital Stay (HOSPITAL_COMMUNITY)
Admission: EM | Admit: 2013-09-14 | Discharge: 2013-09-15 | DRG: 556 | Disposition: A | Discharge status: Home / Self Care | Payer: Medicare Other | Attending: Internal Medicine | Admitting: Internal Medicine

## 2013-09-14 ENCOUNTER — Encounter (HOSPITAL_COMMUNITY): Payer: Self-pay | Admitting: Nurse Practitioner

## 2013-09-14 ENCOUNTER — Emergency Department (HOSPITAL_COMMUNITY): Payer: Medicare Other

## 2013-09-14 DIAGNOSIS — J95821 Acute postprocedural respiratory failure: Secondary | ICD-10-CM

## 2013-09-14 DIAGNOSIS — N400 Enlarged prostate without lower urinary tract symptoms: Secondary | ICD-10-CM

## 2013-09-14 DIAGNOSIS — E7800 Pure hypercholesterolemia, unspecified: Secondary | ICD-10-CM | POA: Diagnosis present

## 2013-09-14 DIAGNOSIS — D62 Acute posthemorrhagic anemia: Secondary | ICD-10-CM

## 2013-09-14 DIAGNOSIS — F3289 Other specified depressive episodes: Secondary | ICD-10-CM | POA: Diagnosis present

## 2013-09-14 DIAGNOSIS — R42 Dizziness and giddiness: Secondary | ICD-10-CM

## 2013-09-14 DIAGNOSIS — I10 Essential (primary) hypertension: Secondary | ICD-10-CM | POA: Diagnosis present

## 2013-09-14 DIAGNOSIS — Z87820 Personal history of traumatic brain injury: Secondary | ICD-10-CM | POA: Diagnosis present

## 2013-09-14 DIAGNOSIS — Z720 Tobacco use: Secondary | ICD-10-CM

## 2013-09-14 DIAGNOSIS — Z79899 Other long term (current) drug therapy: Secondary | ICD-10-CM

## 2013-09-14 DIAGNOSIS — E119 Type 2 diabetes mellitus without complications: Secondary | ICD-10-CM | POA: Diagnosis present

## 2013-09-14 DIAGNOSIS — R059 Cough, unspecified: Secondary | ICD-10-CM | POA: Diagnosis present

## 2013-09-14 DIAGNOSIS — R55 Syncope and collapse: Secondary | ICD-10-CM

## 2013-09-14 DIAGNOSIS — R531 Weakness: Secondary | ICD-10-CM

## 2013-09-14 DIAGNOSIS — Z87891 Personal history of nicotine dependence: Secondary | ICD-10-CM | POA: Diagnosis present

## 2013-09-14 DIAGNOSIS — F329 Major depressive disorder, single episode, unspecified: Secondary | ICD-10-CM | POA: Diagnosis present

## 2013-09-14 DIAGNOSIS — F10929 Alcohol use, unspecified with intoxication, unspecified: Secondary | ICD-10-CM

## 2013-09-14 DIAGNOSIS — R05 Cough: Secondary | ICD-10-CM | POA: Diagnosis present

## 2013-09-14 DIAGNOSIS — R29898 Other symptoms and signs involving the musculoskeletal system: Principal | ICD-10-CM | POA: Diagnosis present

## 2013-09-14 DIAGNOSIS — E039 Hypothyroidism, unspecified: Secondary | ICD-10-CM | POA: Diagnosis present

## 2013-09-14 DIAGNOSIS — J329 Chronic sinusitis, unspecified: Secondary | ICD-10-CM
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
## 2013-09-14 NOTE — ED Notes (Addendum)
States he was seen here yesterday for dizziness and discharged home but "its getting worse." states he fell at home 3 times today because he was so dizzy. Denies any pain or injuries from the fall. States he lives alone and is afraid of being there by himself

## 2013-09-14 NOTE — ED Notes (Signed)
Neurologist at bedside. 

## 2013-09-15 ENCOUNTER — Encounter (HOSPITAL_COMMUNITY): Payer: Self-pay | Admitting: Internal Medicine

## 2013-09-15 DIAGNOSIS — E119 Type 2 diabetes mellitus without complications: Secondary | ICD-10-CM

## 2013-09-15 DIAGNOSIS — M6281 Muscle weakness (generalized): Secondary | ICD-10-CM

## 2013-09-15 DIAGNOSIS — E039 Hypothyroidism, unspecified: Secondary | ICD-10-CM

## 2013-09-15 DIAGNOSIS — R05 Cough: Secondary | ICD-10-CM | POA: Diagnosis present

## 2013-09-15 DIAGNOSIS — R42 Dizziness and giddiness: Secondary | ICD-10-CM

## 2013-09-15 DIAGNOSIS — R531 Weakness: Secondary | ICD-10-CM | POA: Diagnosis present

## 2013-09-15 LAB — BASIC METABOLIC PANEL
BUN: 13 mg/dL (ref 6–23)
CO2: 28 mEq/L (ref 19–32)
Calcium: 9.5 mg/dL (ref 8.4–10.5)
Chloride: 95 mEq/L — ABNORMAL LOW (ref 96–112)
Creatinine, Ser: 0.76 mg/dL (ref 0.50–1.35)
Glucose, Bld: 84 mg/dL (ref 70–99)
Potassium: 3.9 mEq/L (ref 3.5–5.1)

## 2013-09-15 LAB — CREATININE, SERUM
Creatinine, Ser: 0.79 mg/dL (ref 0.50–1.35)
GFR calc Af Amer: >90 mL/min (ref >90)
GFR calc non Af Amer: 90 mL/min — ABNORMAL LOW (ref >90)

## 2013-09-15 LAB — CBC
HCT: 40.9 % (ref 39.0–52.0)
Hemoglobin: 14.8 g/dL (ref 13.0–17.0)
Hemoglobin: 14.3 g/dL (ref 13.0–17.0)
MCH: 32.8 pg (ref 26.0–34.0)
MCH: 32.4 pg (ref 26.0–34.0)
MCHC: 35.8 g/dL (ref 30.0–36.0)
MCHC: 35 g/dL (ref 30.0–36.0)
MCV: 91.6 fL (ref 78.0–100.0)
MCV: 92.5 fL (ref 78.0–100.0)
Platelets: 211 10*3/uL (ref 150–400)
Platelets: 227 10*3/uL (ref 150–400)
RBC: 4.51 MIL/uL (ref 4.22–5.81)
RBC: 4.42 MIL/uL (ref 4.22–5.81)
RDW: 13.2 % (ref 11.5–15.5)
RDW: 13.2 % (ref 11.5–15.5)
WBC: 9.9 10*3/uL (ref 4.0–10.5)

## 2013-09-15 LAB — HEMOGLOBIN A1C: Hgb A1c MFr Bld: 6.2 % — ABNORMAL HIGH (ref <5.7)

## 2013-09-15 LAB — TSH: TSH: 32.296 u[IU]/mL — ABNORMAL HIGH (ref 0.350–4.500)

## 2013-09-15 LAB — CK: Total CK: 73 U/L (ref 7–232)

## 2013-09-15 LAB — GLUCOSE, CAPILLARY: Glucose-Capillary: 85 mg/dL (ref 70–99)

## 2013-09-15 MED ORDER — ALBUTEROL SULFATE HFA 108 (90 BASE) MCG/ACT IN AERS: 2.0000 | INHALATION_SPRAY | Freq: Four times a day (QID) | RESPIRATORY_TRACT | Status: AC | PRN

## 2013-09-15 MED ORDER — ACETAMINOPHEN 650 MG RE SUPP: 650.0000 mg | Freq: Four times a day (QID) | RECTAL | Status: DC | PRN

## 2013-09-15 MED ORDER — INFLUENZA VAC SPLIT QUAD 0.5 ML IM SUSP: 0.5000 mL | INTRAMUSCULAR | Status: DC

## 2013-09-15 MED ORDER — INSULIN ASPART 100 UNIT/ML ~~LOC~~ SOLN: 0.0000 [IU] | Freq: Three times a day (TID) | SUBCUTANEOUS | Status: DC

## 2013-09-15 MED ORDER — ACETAMINOPHEN 325 MG PO TABS: 650.0000 mg | ORAL_TABLET | Freq: Four times a day (QID) | ORAL | Status: DC | PRN

## 2013-09-15 MED ORDER — LEVOFLOXACIN 500 MG PO TABS: 500.0000 mg | ORAL_TABLET | Freq: Every day | ORAL | Status: DC

## 2013-09-15 MED ORDER — ONDANSETRON HCL 4 MG PO TABS: 4.0000 mg | ORAL_TABLET | Freq: Four times a day (QID) | ORAL | Status: DC | PRN

## 2013-09-15 MED ORDER — LEVOFLOXACIN IN D5W 750 MG/150ML IV SOLN
750.0000 mg | Freq: Every day | INTRAVENOUS | Status: DC
Start: 2013-09-15 — End: 2013-09-15
  Administered 2013-09-15: 750 mg via INTRAVENOUS
  Filled 2013-09-15 (×2): qty 150

## 2013-09-15 MED ORDER — HYDRALAZINE HCL 20 MG/ML IJ SOLN: 10.0000 mg | INTRAMUSCULAR | Status: DC | PRN

## 2013-09-15 MED ORDER — ENOXAPARIN SODIUM 40 MG/0.4ML ~~LOC~~ SOLN
40.0000 mg | Freq: Every day | SUBCUTANEOUS | Status: DC
  Filled 2013-09-15: qty 0.4

## 2013-09-15 MED ORDER — MECLIZINE HCL 32 MG PO TABS: 32.0000 mg | ORAL_TABLET | Freq: Three times a day (TID) | ORAL | Status: DC | PRN

## 2013-09-15 MED ORDER — TRAMADOL HCL 50 MG PO TABS
50.0000 mg | ORAL_TABLET | Freq: Four times a day (QID) | ORAL | Status: DC | PRN
  Administered 2013-09-15: 100 mg via ORAL
  Filled 2013-09-15: qty 2

## 2013-09-15 MED ORDER — SODIUM CHLORIDE 0.9 % IV SOLN
INTRAVENOUS | Status: DC
  Administered 2013-09-15: 01:00:00 via INTRAVENOUS

## 2013-09-15 MED ORDER — ONDANSETRON HCL 4 MG/2ML IJ SOLN: 4.0000 mg | Freq: Four times a day (QID) | INTRAMUSCULAR | Status: DC | PRN

## 2013-09-15 MED ORDER — PNEUMOCOCCAL VAC POLYVALENT 25 MCG/0.5ML IJ INJ: 0.5000 mL | INJECTION | INTRAMUSCULAR | Status: DC

## 2013-09-15 MED ORDER — LATANOPROST 0.005 % OP SOLN: 1.0000 [drp] | Freq: Four times a day (QID) | OPHTHALMIC | Status: DC
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
## 2013-09-15 NOTE — Progress Notes (Addendum)
Pt uncooperative this morning with initial shift assessment.  Pt became very upset when asked LOC questions, claiming "I'm a grown ass man and I don't have time for this shit".  When asked to perform neuro exam, he said he had already been asked to do these activities and he claimed that he won't be doing anything else today.  I informed him that I would keep him updated with his plan of care for this hospital admission and he said it didn't matter because he was "about to leave in a few minutes anyway".  Charge nurse alerted about him possibly leaving AMA.  Will monitor.

## 2013-09-15 NOTE — Progress Notes (Signed)
Pt refused discharge summary and related education materials.  RN expressed the importance of this material, pt refused.  RN to request SW for bus pass, pt decided he did not want to wait for it and opted to walk home.  Pt for discharge home today, discharge information and Rx given, but with no evidence of learning/understanding/compliance. Pt left the floor without any assistance.
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
## 2013-09-15 NOTE — Progress Notes (Signed)
Tried to contact the patient to inform the labs, abnormal tsh;  -no answer on the phone listed in epic: [phone] -patient was informed to follow up with PCP in two weeks on lab results   Alicea Wente N

## 2013-09-17 NOTE — ED Provider Notes (Signed)
Medical screening examination/treatment/procedure(s) were performed by non-physician practitioner and as supervising physician I was immediately available for consultation/collaboration.  Deionna Marcantonio R. Traniyah Hallett, MD 09/17/13 1019 

## 2014-01-29 ENCOUNTER — Emergency Department (HOSPITAL_COMMUNITY)
Admission: EM | Admit: 2014-01-29 | Discharge: 2014-01-29 | Disposition: A | Discharge status: Home / Self Care | Payer: Medicare Other | Attending: Emergency Medicine | Admitting: Emergency Medicine

## 2014-01-29 ENCOUNTER — Encounter (HOSPITAL_COMMUNITY): Payer: Self-pay | Admitting: Emergency Medicine

## 2014-01-29 DIAGNOSIS — Z8739 Personal history of other diseases of the musculoskeletal system and connective tissue: Secondary | ICD-10-CM | POA: Insufficient documentation

## 2014-01-29 DIAGNOSIS — G8929 Other chronic pain: Secondary | ICD-10-CM | POA: Insufficient documentation

## 2014-01-29 DIAGNOSIS — F411 Generalized anxiety disorder: Secondary | ICD-10-CM | POA: Insufficient documentation

## 2014-01-29 DIAGNOSIS — R1084 Generalized abdominal pain: Secondary | ICD-10-CM | POA: Insufficient documentation

## 2014-01-29 DIAGNOSIS — I10 Essential (primary) hypertension: Secondary | ICD-10-CM | POA: Insufficient documentation

## 2014-01-29 DIAGNOSIS — F1021 Alcohol dependence, in remission: Secondary | ICD-10-CM | POA: Insufficient documentation

## 2014-01-29 DIAGNOSIS — R197 Diarrhea, unspecified: Secondary | ICD-10-CM | POA: Insufficient documentation

## 2014-01-29 DIAGNOSIS — I209 Angina pectoris, unspecified: Secondary | ICD-10-CM | POA: Insufficient documentation

## 2014-01-29 DIAGNOSIS — R5381 Other malaise: Secondary | ICD-10-CM | POA: Insufficient documentation

## 2014-01-29 DIAGNOSIS — R45851 Suicidal ideations: Secondary | ICD-10-CM | POA: Insufficient documentation

## 2014-01-29 DIAGNOSIS — Z79899 Other long term (current) drug therapy: Secondary | ICD-10-CM | POA: Insufficient documentation

## 2014-01-29 DIAGNOSIS — Z87891 Personal history of nicotine dependence: Secondary | ICD-10-CM | POA: Insufficient documentation

## 2014-01-29 DIAGNOSIS — Z9181 History of falling: Secondary | ICD-10-CM | POA: Insufficient documentation

## 2014-01-29 DIAGNOSIS — R5383 Other fatigue: Secondary | ICD-10-CM

## 2014-01-29 DIAGNOSIS — Z8719 Personal history of other diseases of the digestive system: Secondary | ICD-10-CM | POA: Insufficient documentation

## 2014-01-29 DIAGNOSIS — E119 Type 2 diabetes mellitus without complications: Secondary | ICD-10-CM | POA: Insufficient documentation

## 2014-01-29 DIAGNOSIS — R51 Headache: Secondary | ICD-10-CM | POA: Insufficient documentation

## 2014-01-29 DIAGNOSIS — F329 Major depressive disorder, single episode, unspecified: Secondary | ICD-10-CM

## 2014-01-29 DIAGNOSIS — F32A Depression, unspecified: Secondary | ICD-10-CM

## 2014-01-29 DIAGNOSIS — F3289 Other specified depressive episodes: Secondary | ICD-10-CM | POA: Insufficient documentation

## 2014-01-29 DIAGNOSIS — Z8701 Personal history of pneumonia (recurrent): Secondary | ICD-10-CM | POA: Insufficient documentation

## 2014-01-29 DIAGNOSIS — Z9889 Other specified postprocedural states: Secondary | ICD-10-CM | POA: Insufficient documentation

## 2014-01-29 DIAGNOSIS — R42 Dizziness and giddiness: Secondary | ICD-10-CM | POA: Insufficient documentation

## 2014-01-29 DIAGNOSIS — R1110 Vomiting, unspecified: Secondary | ICD-10-CM | POA: Insufficient documentation

## 2014-01-29 LAB — URINALYSIS, ROUTINE W REFLEX MICROSCOPIC
GLUCOSE, UA: NEGATIVE mg/dL
KETONES UR: NEGATIVE mg/dL
Leukocytes, UA: NEGATIVE
Nitrite: NEGATIVE
PROTEIN: 30 mg/dL — AB
Specific Gravity, Urine: 1.03 (ref 1.005–1.030)
Urobilinogen, UA: 0.2 mg/dL (ref 0.0–1.0)
pH: 5 (ref 5.0–8.0)

## 2014-01-29 LAB — ETHANOL: Alcohol, Ethyl (B): <11 mg/dL (ref 0–11)

## 2014-01-29 LAB — COMPREHENSIVE METABOLIC PANEL
ALBUMIN: 4.3 g/dL (ref 3.5–5.2)
ALT: 13 U/L (ref 0–53)
AST: 21 U/L (ref 0–37)
Alkaline Phosphatase: 83 U/L (ref 39–117)
BUN: 34 mg/dL — ABNORMAL HIGH (ref 6–23)
CO2: 22 mEq/L (ref 19–32)
CREATININE: 1.38 mg/dL — AB (ref 0.50–1.35)
Calcium: 9.8 mg/dL (ref 8.4–10.5)
Chloride: 101 mEq/L (ref 96–112)
GFR calc Af Amer: 59 mL/min — ABNORMAL LOW (ref >90)
GFR calc non Af Amer: 51 mL/min — ABNORMAL LOW (ref >90)
Glucose, Bld: 135 mg/dL — ABNORMAL HIGH (ref 70–99)
POTASSIUM: 4.5 meq/L (ref 3.7–5.3)
Sodium: 139 mEq/L (ref 137–147)
TOTAL PROTEIN: 9.6 g/dL — AB (ref 6.0–8.3)
Total Bilirubin: 0.4 mg/dL (ref 0.3–1.2)

## 2014-01-29 LAB — CBC
HCT: 49.1 % (ref 39.0–52.0)
Hemoglobin: 17.6 g/dL — ABNORMAL HIGH (ref 13.0–17.0)
MCH: 33 pg (ref 26.0–34.0)
MCHC: 35.8 g/dL (ref 30.0–36.0)
MCV: 92.1 fL (ref 78.0–100.0)
PLATELETS: 188 10*3/uL (ref 150–400)
RBC: 5.33 MIL/uL (ref 4.22–5.81)
RDW: 13.2 % (ref 11.5–15.5)
WBC: 9.2 10*3/uL (ref 4.0–10.5)

## 2014-01-29 LAB — URINE MICROSCOPIC-ADD ON

## 2014-01-29 LAB — LIPASE, BLOOD: Lipase: 24 U/L (ref 11–59)

## 2014-01-29 LAB — CBG MONITORING, ED: Glucose-Capillary: 86 mg/dL (ref 70–99)

## 2014-01-29 LAB — ACETAMINOPHEN LEVEL

## 2014-01-29 LAB — SALICYLATE LEVEL

## 2014-01-29 LAB — I-STAT TROPONIN, ED: Troponin i, poc: 0.01 ng/mL (ref 0.00–0.08)

## 2014-01-29 MED ORDER — ONDANSETRON 8 MG PO TBDP: ORAL_TABLET | ORAL | Status: DC

## 2014-01-29 MED ORDER — ONDANSETRON HCL 4 MG/2ML IJ SOLN
4.0000 mg | Freq: Once | INTRAMUSCULAR | Status: AC
  Administered 2014-01-29: 4 mg via INTRAVENOUS
  Filled 2014-01-29: qty 2

## 2014-01-29 MED ORDER — SODIUM CHLORIDE 0.9 % IV BOLUS (SEPSIS)
2000.0000 mL | Freq: Once | INTRAVENOUS | Status: AC
  Administered 2014-01-29: 2000 mL via INTRAVENOUS

## 2014-01-29 MED ORDER — LOPERAMIDE HCL 2 MG PO CAPS: ORAL_CAPSULE | ORAL | Status: DC

## 2014-01-29 MED ORDER — METOCLOPRAMIDE HCL 10 MG PO TABS: 10.0000 mg | ORAL_TABLET | Freq: Four times a day (QID) | ORAL | Status: DC | PRN

## 2014-01-29 NOTE — ED Notes (Signed)
Pt states that he is suicidal and if he goes home feeling this same way he will take his gun and blow his brains out.  I questioned patient several times and he states he is suicidal and has history.

## 2014-01-29 NOTE — ED Notes (Signed)
Notified AC, Candy, of need for sitter for suicidal thoughts

## 2014-01-29 NOTE — Discharge Instructions (Signed)
Abdominal (belly) pain can be caused by many things. Your caregiver performed an examination and possibly ordered blood/urine tests and imaging (CT scan, x-rays, ultrasound). Many cases can be observed and treated at home after initial evaluation in the emergency department. Even though you are being discharged home, abdominal pain can be unpredictable. Therefore, you need a repeated exam if your pain does not resolve, returns, or worsens. Most patients with abdominal pain don't have to be admitted to the hospital or have surgery, but serious problems like appendicitis and gallbladder attacks can start out as nonspecific pain. Many abdominal conditions cannot be diagnosed in one visit, so follow-up evaluations are very important. SEEK IMMEDIATE MEDICAL ATTENTION IF: The pain does not go away or becomes severe.  A temperature above 101 develops.  Repeated vomiting occurs (multiple episodes).  The pain becomes localized to portions of the abdomen. The right side could possibly be appendicitis. In an adult, the left lower portion of the abdomen could be colitis or diverticulitis.  Blood is being passed in stools or vomit (bright red or black tarry stools).  Return also if you develop chest pain, difficulty breathing, dizziness or fainting, or become confused, poorly responsive, or inconsolable (young children).  You have signs of possible anxiety and/or depression. This is a very common problem.  Be sure to call your caregiver and arrange for follow-up care as suggested by our staff. RETURN IMMEDIATELY IF DEVELOP threat to harm self or others, suicidal or homicidal thoughts, hallucinations or confusion, unable to be cared for at home or uncontrolled behavior, or other concerns.     Behavioral Health Resources in the [facility]  Intensive Outpatient Programs: [facility]      [place], [place] Both a day and evening program       [facility] Outpatient     [address]        [place], [place] [address] [phone]         ADS: [facility] [place]: [phone] or [phone] [address] [place], [place] [address] [email or web address]  Mobile Crisis Teams:                                        [facility]         Mobile Crisis Care Unit [phone]             [facility] [address] Lady [place] [address] [place] [phone]  Self-Help/Support Groups: [facility] [employer] of support groups [phone] (call for more info)  Narcotics Anonymous (NA) Caring [facility] [address] [place] - 2 meetings at this location  Residential Treatment Programs:  [place]       [place] [address], [place], [place]  [address] [phone]  [place]  [address] [place], [place] [address] [phone] Admissions: 8am-3pm M-F  Incentives Substance [place], [place] [address]       [phone]         [facility] [address] [place], [place]  [facility] [place] [address] [place], [place]  [facility] - Intensive Outpatient      [address] [place] [address]     [place], [place]         [facility]     [place], [place] or [phone]  Residential Treatment Services (RTS)  [place], [place]  [facility] [address]                                                [place]  [facility] Resources: [phone]               General Therapy                                                Domenic Schwab, PhD        [address] [place], [place] [address]         [facility]   [address] [place], [place] [address] [phone]  [facility] [address] [place], [place] [address] [phone] Insurance/Medicaid/sponsorship through [facility] and Families                                              [address] [place]                                        [place], [place] [address]    Therapy/tele-psych/case         [place] [address], [place]  [address]  Adolescent/group home/case management [phone]                                           Rosette Reveal PhD       General therapy       Insurance   [phone]         Dr. Adele Schilder Insurance [phone] M-F  [place] [facility] Medicaid, sponsorship [phone]

## 2014-01-29 NOTE — ED Notes (Addendum)
Pt refuses paper scrubs.  Security notified that pt needs to be wanded.  AC notified of need for sitter.

## 2014-01-29 NOTE — ED Notes (Signed)
Pt informed he needs to change into paper scrubs. Pt sts "Paper scrubs, I might as well go home if that's what I have to wear." RN aware of refusal

## 2014-01-29 NOTE — ED Notes (Signed)
Pt states he has passed out each day over the last three days

## 2014-01-29 NOTE — ED Notes (Signed)
Pt stating that he is no longer suicidal.  Stated that he when said he was going to shoot himself in the head he meant "If I go home feeling just like this I might as well shoot myself because I feel very sick". Pt did not mean that he was going to intentionally harm himself.  "I am sorry.  I did not mean that.  I am just frustrated and sick. Please help me feel better."
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
## 2014-01-29 NOTE — ED Notes (Signed)
Pt is here with diarrhea, weakness, and feels off balance.  Pt states he cannot take, reports vomits anything he tries to swallow.    No pain

## 2014-04-05 ENCOUNTER — Other Ambulatory Visit: Payer: Self-pay | Admitting: Ophthalmology

## 2014-04-05 DIAGNOSIS — H532 Diplopia: Secondary | ICD-10-CM

## 2014-04-05 LAB — CREATININE, SERUM: CREATININE: 0.8 mg/dL (ref 0.50–1.35)

## 2014-04-05 LAB — BUN: BUN: 8 mg/dL (ref 6–23)

## 2014-04-07 ENCOUNTER — Ambulatory Visit
Admission: RE | Admit: 2014-04-07 | Discharge: 2014-04-07 | Disposition: A | Discharge status: Home / Self Care | Payer: Medicare Other | Source: Ambulatory Visit | Attending: Ophthalmology | Admitting: Ophthalmology

## 2014-04-07 DIAGNOSIS — H532 Diplopia: Secondary | ICD-10-CM

## 2014-04-07 MED ORDER — IOHEXOL 300 MG/ML  SOLN
75.0000 mL | Freq: Once | INTRAMUSCULAR | Status: AC | PRN
  Administered 2014-04-07: 75 mL via INTRAVENOUS

## 2014-09-27 ENCOUNTER — Encounter (HOSPITAL_COMMUNITY): Payer: Self-pay | Admitting: Emergency Medicine

## 2014-09-27 ENCOUNTER — Emergency Department (HOSPITAL_COMMUNITY): Payer: Medicare Other

## 2014-09-27 ENCOUNTER — Emergency Department (HOSPITAL_COMMUNITY)
Admission: EM | Admit: 2014-09-27 | Discharge: 2014-09-27 | Disposition: A | Discharge status: Home / Self Care | Payer: Medicare Other | Attending: Emergency Medicine | Admitting: Emergency Medicine

## 2014-09-27 DIAGNOSIS — E119 Type 2 diabetes mellitus without complications: Secondary | ICD-10-CM | POA: Insufficient documentation

## 2014-09-27 DIAGNOSIS — I10 Essential (primary) hypertension: Secondary | ICD-10-CM | POA: Diagnosis not present

## 2014-09-27 DIAGNOSIS — Z8701 Personal history of pneumonia (recurrent): Secondary | ICD-10-CM | POA: Insufficient documentation

## 2014-09-27 DIAGNOSIS — G8929 Other chronic pain: Secondary | ICD-10-CM | POA: Diagnosis not present

## 2014-09-27 DIAGNOSIS — R531 Weakness: Secondary | ICD-10-CM | POA: Insufficient documentation

## 2014-09-27 DIAGNOSIS — R42 Dizziness and giddiness: Secondary | ICD-10-CM | POA: Diagnosis not present

## 2014-09-27 DIAGNOSIS — Z8719 Personal history of other diseases of the digestive system: Secondary | ICD-10-CM | POA: Insufficient documentation

## 2014-09-27 DIAGNOSIS — Z87891 Personal history of nicotine dependence: Secondary | ICD-10-CM | POA: Diagnosis not present

## 2014-09-27 DIAGNOSIS — Z8739 Personal history of other diseases of the musculoskeletal system and connective tissue: Secondary | ICD-10-CM | POA: Insufficient documentation

## 2014-09-27 DIAGNOSIS — R002 Palpitations: Secondary | ICD-10-CM | POA: Insufficient documentation

## 2014-09-27 LAB — COMPREHENSIVE METABOLIC PANEL
ALBUMIN: 3.8 g/dL (ref 3.5–5.2)
ALT: 13 U/L (ref 0–53)
ANION GAP: 11 (ref 5–15)
AST: 20 U/L (ref 0–37)
Alkaline Phosphatase: 50 U/L (ref 39–117)
BUN: 10 mg/dL (ref 6–23)
CO2: 26 mEq/L (ref 19–32)
Calcium: 8.7 mg/dL (ref 8.4–10.5)
Chloride: 102 mEq/L (ref 96–112)
Creatinine, Ser: 0.85 mg/dL (ref 0.50–1.35)
GFR calc Af Amer: >90 mL/min (ref >90)
GFR calc non Af Amer: 86 mL/min — ABNORMAL LOW (ref >90)
Glucose, Bld: 89 mg/dL (ref 70–99)
Potassium: 3.8 mEq/L (ref 3.7–5.3)
Sodium: 139 mEq/L (ref 137–147)
TOTAL PROTEIN: 7.6 g/dL (ref 6.0–8.3)
Total Bilirubin: 1.4 mg/dL — ABNORMAL HIGH (ref 0.3–1.2)

## 2014-09-27 LAB — URINE MICROSCOPIC-ADD ON

## 2014-09-27 LAB — URINALYSIS, ROUTINE W REFLEX MICROSCOPIC
Bilirubin Urine: NEGATIVE
GLUCOSE, UA: NEGATIVE mg/dL
KETONES UR: 15 mg/dL — AB
Leukocytes, UA: NEGATIVE
Nitrite: NEGATIVE
Protein, ur: NEGATIVE mg/dL
Specific Gravity, Urine: 1.021 (ref 1.005–1.030)
Urobilinogen, UA: 1 mg/dL (ref 0.0–1.0)
pH: 6 (ref 5.0–8.0)

## 2014-09-27 LAB — RAPID URINE DRUG SCREEN, HOSP PERFORMED
AMPHETAMINES: NOT DETECTED
Barbiturates: NOT DETECTED
Benzodiazepines: NOT DETECTED
Cocaine: NOT DETECTED
OPIATES: NOT DETECTED
Tetrahydrocannabinol: POSITIVE — AB

## 2014-09-27 LAB — DIFFERENTIAL
BASOS PCT: 0 % (ref 0–1)
Basophils Absolute: 0 10*3/uL (ref 0.0–0.1)
EOS ABS: 0 10*3/uL (ref 0.0–0.7)
Eosinophils Relative: 1 % (ref 0–5)
Lymphocytes Relative: 33 % (ref 12–46)
Lymphs Abs: 2 10*3/uL (ref 0.7–4.0)
MONOS PCT: 6 % (ref 3–12)
Monocytes Absolute: 0.4 10*3/uL (ref 0.1–1.0)
Neutro Abs: 3.6 10*3/uL (ref 1.7–7.7)
Neutrophils Relative %: 60 % (ref 43–77)

## 2014-09-27 LAB — I-STAT CHEM 8, ED
BUN: 10 mg/dL (ref 6–23)
CHLORIDE: 102 meq/L (ref 96–112)
Calcium, Ion: 1.09 mmol/L — ABNORMAL LOW (ref 1.13–1.30)
Creatinine, Ser: 0.8 mg/dL (ref 0.50–1.35)
Glucose, Bld: 90 mg/dL (ref 70–99)
HEMATOCRIT: 46 % (ref 39.0–52.0)
Hemoglobin: 15.6 g/dL (ref 13.0–17.0)
Potassium: 3.6 mEq/L — ABNORMAL LOW (ref 3.7–5.3)
SODIUM: 139 meq/L (ref 137–147)
TCO2: 24 mmol/L (ref 0–100)

## 2014-09-27 LAB — PROTIME-INR
INR: 1.13 (ref 0.00–1.49)
Prothrombin Time: 14.6 seconds (ref 11.6–15.2)

## 2014-09-27 LAB — I-STAT TROPONIN, ED: TROPONIN I, POC: 0.01 ng/mL (ref 0.00–0.08)

## 2014-09-27 LAB — CBC
HCT: 39.9 % (ref 39.0–52.0)
HEMOGLOBIN: 13.8 g/dL (ref 13.0–17.0)
MCH: 33.3 pg (ref 26.0–34.0)
MCHC: 34.6 g/dL (ref 30.0–36.0)
MCV: 96.4 fL (ref 78.0–100.0)
Platelets: 160 10*3/uL (ref 150–400)
RBC: 4.14 MIL/uL — ABNORMAL LOW (ref 4.22–5.81)
RDW: 13.7 % (ref 11.5–15.5)
WBC: 6.1 10*3/uL (ref 4.0–10.5)

## 2014-09-27 LAB — ETHANOL

## 2014-09-27 LAB — APTT: aPTT: 27 seconds (ref 24–37)

## 2014-09-27 NOTE — ED Notes (Signed)
Per EMS: 45 min ago pt had an episode where he felt funny in chest, fast hr, sob, nausea, diaphoresis for about 5 min, also happened1 week ago with spontaneous resolve, hx of anxiety, been out of anxiety meds since yesterday.  12 lead wnl.  Never complained of chest pain, brady en route 53-60 reg.  Patient still feels dizzy, hypertensive 180/100.  Not sure of med hx, does not really go to the doctor, unsure of what medications he is supposed to be on because he is out of meds.

## 2014-09-27 NOTE — ED Notes (Signed)
Pt returned from CT scan with this RN. Given several warm blankets and placed back on the monitor for now. Given call light and encouraged to call us if he needed more blankets. Pt verbalized cooperation and understanding with this RN.

## 2014-09-27 NOTE — ED Notes (Signed)
Helping pt get dressed. Pt very unsteady, unable to stand by himself without almost falling. 3 RN's speaking with pt about the risks of him going home and not being able to walk/stroke symptoms. Helped pt get back into bed. Educated pt that he needs to please stay since he cannot even walk. Pt does not have any friends/family to pick him up. Will have pt sit in stretcher and notify PA. Will see if pt can stay to at least obtain CT

## 2014-09-27 NOTE — ED Notes (Signed)
Patient wants to leave, states he is tired of people asking him the same questions (regarding neuro exams/checks and assessments).  Patient is unwilling to hear explanations to why they must be done, patient is refusing the ativan offered by Oletta Darter. PA aware.  Patient leaving AMA.

## 2014-09-27 NOTE — ED Notes (Signed)
Curtain left open and encouraged pt to call the staff to help get up to use the urinal when needed to keep from falling. Pt easily upset and tearful.

## 2014-09-27 NOTE — Discharge Instructions (Signed)
You are leaving before your urine tests have resulted. If you do have a urine infection someone will call in an antibiotic for you at that time. Please follow up with your primary care physician in 1-2 days. If you do not have one please call the [place] number listed above. Please read all discharge instructions and return precautions.       Dizziness Dizziness is a common problem. It is a feeling of unsteadiness or light-headedness. You may feel like you are about to faint. Dizziness can lead to injury if you stumble or fall. A person of any age group can suffer from dizziness, but dizziness is more common in older adults. CAUSES  Dizziness can be caused by many different things, including:  Middle ear problems.  Standing for too long.  Infections.  An allergic reaction.  Aging.  An emotional response to something, such as the sight of blood.  Side effects of medicines.  Tiredness.  Problems with circulation or blood pressure.  Excessive use of alcohol or medicines, or illegal drug use.  Breathing too fast (hyperventilation).  An irregular heart rhythm (arrhythmia).  A low red blood cell count (anemia).  Pregnancy.  Vomiting, diarrhea, fever, or other illnesses that cause body fluid loss (dehydration).  Diseases or conditions such as Parkinson's disease, high blood pressure (hypertension), diabetes, and thyroid problems.  Exposure to extreme heat. DIAGNOSIS  Your health care provider will ask about your symptoms, perform a physical exam, and perform an electrocardiogram (ECG) to record the electrical activity of your heart. Your health care provider may also perform other heart or blood tests to determine the cause of your dizziness. These may include:  Transthoracic echocardiogram (TTE). During echocardiography, sound waves are used to evaluate how blood flows through your heart.  Transesophageal echocardiogram (TEE).  Cardiac monitoring.  This allows your health care provider to monitor your heart rate and rhythm in real time.  Holter monitor. This is a portable device that records your heartbeat and can help diagnose heart arrhythmias. It allows your health care provider to track your heart activity for several days if needed.  Stress tests by exercise or by giving medicine that makes the heart beat faster. TREATMENT  Treatment of dizziness depends on the cause of your symptoms and can vary greatly. HOME CARE INSTRUCTIONS   Drink enough fluids to keep your urine clear or pale yellow. This is especially important in very hot weather. In older adults, it is also important in cold weather.  Take your medicine exactly as directed if your dizziness is caused by medicines. When taking blood pressure medicines, it is especially important to get up slowly.  Rise slowly from chairs and steady yourself until you feel okay.  In the morning, first sit up on the side of the bed. When you feel okay, stand slowly while holding onto something until you know your balance is fine.  Move your legs often if you need to stand in one place for a long time. Tighten and relax your muscles in your legs while standing.  Have someone stay with you for 1-2 days if dizziness continues to be a problem. Do this until you feel you are well enough to stay alone. Have the person call your health care provider if he or she notices changes in you that are concerning.  Do not drive or use heavy machinery if you feel dizzy.  Do not drink alcohol. SEEK IMMEDIATE MEDICAL CARE IF:   Your dizziness  or light-headedness gets worse.  You feel nauseous or vomit.  You have problems talking, walking, or using your arms, hands, or legs.  You feel weak.  You are not thinking clearly or you have trouble forming sentences. It may take a friend or family member to notice this.  You have chest pain, abdominal pain, shortness of breath, or sweating.  Your vision  changes.  You notice any bleeding.  You have side effects from medicine that seems to be getting worse rather than better. MAKE SURE YOU:   Understand these instructions.  Will watch your condition.  Will get help right away if you are not doing well or get worse. Document Released: 05/21/2001 Document Revised: 11/30/2013 Document Reviewed: 06/14/2011 [facility] Patient Information 2015 [place], [place]. This information is not intended to replace advice given to you by your health care provider. Make sure you discuss any questions you have with your health care provider.

## 2014-09-27 NOTE — ED Notes (Signed)
Pt stopped NIH test because he is extremely annoyed that neuro exams keep asking him similar questions. Pt states he just wants to leave and does not want to be bothered. Advised pt that staff is trying to help and assess for possible symptoms of stroke. Pt states he wants everyone to stop and leave him alone. Pt understands the risks of leaving. RN and PA at bedside speaking with pt about risks of leaving. Pt agitated. Pt aware that pt could die from stoke, pt states he would rather die if it's his time.
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
## 2014-09-27 NOTE — ED Notes (Signed)
Pt states he has no headache, no pain, no weakness or dizziness, and feels good. Pt standing without any weakness. Steady gait. Pt having someone come pick him up from ED at this time.

## 2014-09-27 NOTE — ED Notes (Signed)
PA spoke with pt and asked if we give him to ativan pt will stay. Pt agreeable.

## 2014-09-27 NOTE — ED Provider Notes (Signed)
Medical screening examination/treatment/procedure(s) were performed by non-physician practitioner and as supervising physician I was immediately available for consultation/collaboration.   EKG Interpretation   Date/Time:  Tuesday September 27 2014 11:55:59 EDT Ventricular Rate:  51 PR Interval:  167 QRS Duration: 85 QT Interval:  460 QTC Calculation: 424 R Axis:   57 Text Interpretation:  Sinus rhythm Normal intervals Confirmed by Kathrynn Humble,  MD, Donna Snooks ([id number]) on 09/27/2014 12:02:14 PM       Varney Biles, MD 09/27/14 1806

## 2014-09-27 NOTE — ED Notes (Signed)
Pt advised that if he needs to urinate to please not stand on his own.
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
## 2014-09-27 NOTE — ED Notes (Signed)
Pt will not allow further neuro checks

## 2014-09-29 LAB — URINE CULTURE: Colony Count: >=100000

## 2014-12-29 DIAGNOSIS — F29 Unspecified psychosis not due to a substance or known physiological condition: Secondary | ICD-10-CM | POA: Diagnosis not present

## 2015-01-19 DIAGNOSIS — H2511 Age-related nuclear cataract, right eye: Secondary | ICD-10-CM | POA: Diagnosis not present

## 2015-01-19 DIAGNOSIS — H4030X0 Glaucoma secondary to eye trauma, unspecified eye, stage unspecified: Secondary | ICD-10-CM | POA: Diagnosis not present

## 2015-02-01 DIAGNOSIS — Z5181 Encounter for therapeutic drug level monitoring: Secondary | ICD-10-CM | POA: Diagnosis not present

## 2015-03-24 DIAGNOSIS — Z79899 Other long term (current) drug therapy: Secondary | ICD-10-CM | POA: Diagnosis not present

## 2015-03-24 DIAGNOSIS — F29 Unspecified psychosis not due to a substance or known physiological condition: Secondary | ICD-10-CM | POA: Diagnosis not present

## 2015-03-24 DIAGNOSIS — Z5181 Encounter for therapeutic drug level monitoring: Secondary | ICD-10-CM | POA: Diagnosis not present

## 2015-04-21 DIAGNOSIS — Z79899 Other long term (current) drug therapy: Secondary | ICD-10-CM | POA: Diagnosis not present

## 2015-04-21 DIAGNOSIS — Z5181 Encounter for therapeutic drug level monitoring: Secondary | ICD-10-CM | POA: Diagnosis not present

## 2015-05-18 DIAGNOSIS — Z5181 Encounter for therapeutic drug level monitoring: Secondary | ICD-10-CM | POA: Diagnosis not present

## 2015-05-18 DIAGNOSIS — Z79899 Other long term (current) drug therapy: Secondary | ICD-10-CM | POA: Diagnosis not present

## 2015-06-20 DIAGNOSIS — F29 Unspecified psychosis not due to a substance or known physiological condition: Secondary | ICD-10-CM | POA: Diagnosis not present

## 2015-06-21 DIAGNOSIS — F29 Unspecified psychosis not due to a substance or known physiological condition: Secondary | ICD-10-CM | POA: Diagnosis not present

## 2015-08-06 ENCOUNTER — Emergency Department (HOSPITAL_COMMUNITY)
Admission: EM | Admit: 2015-08-06 | Discharge: 2015-08-06 | Disposition: A | Discharge status: Home / Self Care | Payer: Medicare Other | Attending: Emergency Medicine | Admitting: Emergency Medicine

## 2015-08-06 ENCOUNTER — Encounter (HOSPITAL_COMMUNITY): Payer: Self-pay | Admitting: Emergency Medicine

## 2015-08-06 DIAGNOSIS — Z87891 Personal history of nicotine dependence: Secondary | ICD-10-CM | POA: Diagnosis not present

## 2015-08-06 DIAGNOSIS — G8929 Other chronic pain: Secondary | ICD-10-CM | POA: Insufficient documentation

## 2015-08-06 DIAGNOSIS — I209 Angina pectoris, unspecified: Secondary | ICD-10-CM | POA: Diagnosis not present

## 2015-08-06 DIAGNOSIS — Z79899 Other long term (current) drug therapy: Secondary | ICD-10-CM | POA: Diagnosis not present

## 2015-08-06 DIAGNOSIS — I10 Essential (primary) hypertension: Secondary | ICD-10-CM | POA: Diagnosis not present

## 2015-08-06 DIAGNOSIS — Z8701 Personal history of pneumonia (recurrent): Secondary | ICD-10-CM | POA: Insufficient documentation

## 2015-08-06 DIAGNOSIS — R21 Rash and other nonspecific skin eruption: Secondary | ICD-10-CM | POA: Diagnosis present

## 2015-08-06 DIAGNOSIS — L309 Dermatitis, unspecified: Secondary | ICD-10-CM | POA: Insufficient documentation

## 2015-08-06 DIAGNOSIS — E119 Type 2 diabetes mellitus without complications: Secondary | ICD-10-CM | POA: Insufficient documentation

## 2015-08-06 DIAGNOSIS — Z8659 Personal history of other mental and behavioral disorders: Secondary | ICD-10-CM | POA: Diagnosis not present

## 2015-08-06 MED ORDER — CEPHALEXIN 250 MG PO CAPS
500.0000 mg | ORAL_CAPSULE | Freq: Once | ORAL | Status: AC
  Administered 2015-08-06: 500 mg via ORAL
  Filled 2015-08-06: qty 2

## 2015-08-06 MED ORDER — HYDROCORTISONE 1 % EX CREA
TOPICAL_CREAM | Freq: Two times a day (BID) | CUTANEOUS | Status: DC
  Administered 2015-08-06: 08:00:00 via TOPICAL
  Filled 2015-08-06: qty 28

## 2015-08-06 MED ORDER — CEPHALEXIN 500 MG PO CAPS: 500.0000 mg | ORAL_CAPSULE | Freq: Four times a day (QID) | ORAL | Status: DC

## 2015-08-06 NOTE — ED Notes (Signed)
Pt in from home, reports rash to R lower abd. Present X2 weeks

## 2015-08-06 NOTE — ED Notes (Signed)
Pt reporting that he is unhappy that he has not been seen by a provider. Pt reports that he wants to leave if not seen soon. Will let EDP know.
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
## 2015-08-06 NOTE — ED Notes (Signed)
Pt refused to answer the rest of the triage questions. Pt stated that it makes him feel nervous. Threatening to leave AMA if I continued to ask the rest of the triage questions

## 2015-08-06 NOTE — Discharge Instructions (Signed)
Eczema Eczema, also called atopic dermatitis, is a skin disorder that causes inflammation of the skin. It causes a red rash and dry, scaly skin. The skin becomes very itchy. Eczema is generally worse during the cooler winter months and often improves with the warmth of summer. Eczema usually starts showing signs in infancy. Some children outgrow eczema, but it may last through adulthood.  CAUSES  The exact cause of eczema is not known, but it appears to run in families. People with eczema often have a family history of eczema, allergies, asthma, or hay fever. Eczema is not contagious. Flare-ups of the condition may be caused by:   Contact with something you are sensitive or allergic to.   Stress. SIGNS AND SYMPTOMS  Dry, scaly skin.   Red, itchy rash.   Itchiness. This may occur before the skin rash and may be very intense.  DIAGNOSIS  The diagnosis of eczema is usually made based on symptoms and medical history. TREATMENT  Eczema cannot be cured, but symptoms usually can be controlled with treatment and other strategies. A treatment plan might include:  Controlling the itching and scratching.   Use over-the-counter antihistamines as directed for itching. This is especially useful at night when the itching tends to be worse.   Use over-the-counter steroid creams as directed for itching.   Avoid scratching. Scratching makes the rash and itching worse. It may also result in a skin infection (impetigo) due to a break in the skin caused by scratching.   Keeping the skin well moisturized with creams every day. This will seal in moisture and help prevent dryness. Lotions that contain alcohol and water should be avoided because they can dry the skin.   Limiting exposure to things that you are sensitive or allergic to (allergens).   Recognizing situations that cause stress.   Developing a plan to manage stress.  HOME CARE INSTRUCTIONS   Only take over-the-counter or  prescription medicines as directed by your health care provider.   Do not use anything on the skin without checking with your health care provider.   Keep baths or showers short (5 minutes) in warm (not hot) water. Use mild cleansers for bathing. These should be unscented. You may add nonperfumed bath oil to the bath water. It is best to avoid soap and bubble bath.   Immediately after a bath or shower, when the skin is still damp, apply a moisturizing ointment to the entire body. This ointment should be a petroleum ointment. This will seal in moisture and help prevent dryness. The thicker the ointment, the better. These should be unscented.   Keep fingernails cut short. Children with eczema may need to wear soft gloves or mittens at night after applying an ointment.   Dress in clothes made of cotton or cotton blends. Dress lightly, because heat increases itching.   A child with eczema should stay away from anyone with fever blisters or cold sores. The virus that causes fever blisters (herpes simplex) can cause a serious skin infection in children with eczema. SEEK MEDICAL CARE IF:   Your itching interferes with sleep.   Your rash gets worse or is not better within 1 week after starting treatment.   You see pus or soft yellow scabs in the rash area.   You have a fever.   You have a rash flare-up after contact with someone who has fever blisters.  Document Released: 11/22/2000 Document Revised: 09/15/2013 Document Reviewed: 06/28/2013 [facility] Patient Information 2015 [place], [place]. This information  is not intended to replace advice given to you by your health care provider. Make sure you discuss any questions you have with your health care provider. Abscess An abscess is an infected area that contains a collection of pus and debris.It can occur in almost any part of the body. An abscess is also known as a furuncle or boil. CAUSES  An abscess occurs when tissue gets infected.  This can occur from blockage of oil or sweat glands, infection of hair follicles, or a minor injury to the skin. As the body tries to fight the infection, pus collects in the area and creates pressure under the skin. This pressure causes pain. People with weakened immune systems have difficulty fighting infections and get certain abscesses more often.  SYMPTOMS Usually an abscess develops on the skin and becomes a painful mass that is red, warm, and tender. If the abscess forms under the skin, you may feel a moveable soft area under the skin. Some abscesses break open (rupture) on their own, but most will continue to get worse without care. The infection can spread deeper into the body and eventually into the bloodstream, causing you to feel ill.  DIAGNOSIS  Your caregiver will take your medical history and perform a physical exam. A sample of fluid may also be taken from the abscess to determine what is causing your infection. TREATMENT  Your caregiver may prescribe antibiotic medicines to fight the infection. However, taking antibiotics alone usually does not cure an abscess. Your caregiver may need to make a small cut (incision) in the abscess to drain the pus. In some cases, gauze is packed into the abscess to reduce pain and to continue draining the area. HOME CARE INSTRUCTIONS   Only take over-the-counter or prescription medicines for pain, discomfort, or fever as directed by your caregiver.  If you were prescribed antibiotics, take them as directed. Finish them even if you start to feel better.  If gauze is used, follow your caregiver's directions for changing the gauze.  To avoid spreading the infection:  Keep your draining abscess covered with a bandage.  Wash your hands well.  Do not share personal care items, towels, or whirlpools with others.  Avoid skin contact with others.  Keep your skin and clothes clean around the abscess.  Keep all follow-up appointments as directed by  your caregiver. SEEK MEDICAL CARE IF:   You have increased pain, swelling, redness, fluid drainage, or bleeding.  You have muscle aches, chills, or a general ill feeling.  You have a fever. MAKE SURE YOU:   Understand these instructions.  Will watch your condition.  Will get help right away if you are not doing well or get worse. Document Released: 09/04/2005 Document Revised: 05/26/2012 Document Reviewed: 02/07/2012 [facility] Patient Information 2015 [place], [place]. This information is not intended to replace advice given to you by your health care provider. Make sure you discuss any questions you have with your health care provider.

## 2015-08-30 DIAGNOSIS — F3341 Major depressive disorder, recurrent, in partial remission: Secondary | ICD-10-CM | POA: Diagnosis not present

## 2015-08-30 DIAGNOSIS — Z79891 Long term (current) use of opiate analgesic: Secondary | ICD-10-CM | POA: Diagnosis not present

## 2015-08-30 DIAGNOSIS — F28 Other psychotic disorder not due to a substance or known physiological condition: Secondary | ICD-10-CM | POA: Diagnosis not present

## 2015-09-29 DIAGNOSIS — Z5181 Encounter for therapeutic drug level monitoring: Secondary | ICD-10-CM | POA: Diagnosis not present

## 2015-09-29 DIAGNOSIS — Z79899 Other long term (current) drug therapy: Secondary | ICD-10-CM | POA: Diagnosis not present

## 2015-10-18 ENCOUNTER — Encounter (HOSPITAL_COMMUNITY): Payer: Self-pay | Admitting: Family Medicine

## 2015-10-18 ENCOUNTER — Emergency Department (HOSPITAL_COMMUNITY)
Admission: EM | Admit: 2015-10-18 | Discharge: 2015-10-18 | Disposition: A | Discharge status: Home / Self Care | Payer: Medicare Other | Attending: Emergency Medicine | Admitting: Emergency Medicine

## 2015-10-18 DIAGNOSIS — Z8739 Personal history of other diseases of the musculoskeletal system and connective tissue: Secondary | ICD-10-CM | POA: Insufficient documentation

## 2015-10-18 DIAGNOSIS — G8929 Other chronic pain: Secondary | ICD-10-CM | POA: Insufficient documentation

## 2015-10-18 DIAGNOSIS — I10 Essential (primary) hypertension: Secondary | ICD-10-CM | POA: Insufficient documentation

## 2015-10-18 DIAGNOSIS — F329 Major depressive disorder, single episode, unspecified: Secondary | ICD-10-CM | POA: Diagnosis not present

## 2015-10-18 DIAGNOSIS — Z9181 History of falling: Secondary | ICD-10-CM | POA: Insufficient documentation

## 2015-10-18 DIAGNOSIS — K0889 Other specified disorders of teeth and supporting structures: Secondary | ICD-10-CM | POA: Diagnosis not present

## 2015-10-18 DIAGNOSIS — Z8701 Personal history of pneumonia (recurrent): Secondary | ICD-10-CM | POA: Insufficient documentation

## 2015-10-18 DIAGNOSIS — Z79899 Other long term (current) drug therapy: Secondary | ICD-10-CM | POA: Insufficient documentation

## 2015-10-18 DIAGNOSIS — Z87891 Personal history of nicotine dependence: Secondary | ICD-10-CM | POA: Diagnosis not present

## 2015-10-18 DIAGNOSIS — E119 Type 2 diabetes mellitus without complications: Secondary | ICD-10-CM | POA: Diagnosis not present

## 2015-10-18 NOTE — ED Notes (Signed)
Pt here for physical to ensure he if fit for oral surgery. sts is supposed to have all of his teeth removed.

## 2015-10-18 NOTE — Discharge Instructions (Signed)
°Emergency Department Resource Guide °1) Find a Doctor and Pay Out of Pocket °Although you won't have to find out who is covered by your insurance plan, it is a good idea to ask around and get recommendations. You will then need to call the office and see if the doctor you have chosen will accept you as a new patient and what types of options they offer for patients who are self-pay. Some doctors offer discounts or will set up payment plans for their patients who do not have insurance, but you will need to ask so you aren't surprised when you get to your appointment. ° °2) Contact Your Local Health Department °Not all health departments have doctors that can see patients for sick visits, but many do, so it is worth a call to see if yours does. If you don't know where your local health department is, you can check in your phone book. The CDC also has a tool to help you locate your state's health department, and many state websites also have listings of all of their local health departments. ° °3) Find a Walk-in Clinic °If your illness is not likely to be very severe or complicated, you may want to try a walk in clinic. These are popping up all over the country in pharmacies, drugstores, and shopping centers. They're usually staffed by nurse practitioners or physician assistants that have been trained to treat common illnesses and complaints. They're usually fairly quick and inexpensive. However, if you have serious medical issues or chronic medical problems, these are probably not your best option. ° °No Primary Care Doctor: °- Call [facility] at  832-8000 - they can help you locate a primary care doctor that  accepts your insurance, provides certain services, etc. °- Physician Referral Service- 1-800-533-3463 ° °Chronic Pain Problems: °Organization         Address  Phone   Notes  °[facility]  (336) 297-2271 Patients need to be referred by their primary care doctor.  ° °Medication  Assistance: °Organization         Address  Phone   Notes  °[place] [facility] [address] °[place], [place] [address] (336) 641-8030 --Must be a resident of [place] °-- Must have NO insurance coverage whatsoever (no Medicaid/ Medicare, etc.) °-- The pt. MUST have a primary care doctor that directs their care regularly and follows them in the community °  °[facility]  (866) 331-1348   °[employer]  (888) 892-1162   ° °Agencies that provide inexpensive medical care: °Organization         Address  Phone   Notes  °[facility]  (336) 832-8035   ° Internal Medicine    (336) 832-7272   °[facility] [address] °[place], [place] [address] (336) 832-4777   °[facility] [address], °[place] (336) 271-4999   °[facility]    (336) 373-0678   °[facility]    (336) 272-1050   °[facility] ° [address], [place] Phone:  (336) 832-4444, Fax:  (336) 832-4440 Hours of Operation:  9 am - 6 pm, M-F.  Also accepts Medicaid/Medicare and self-pay.  °[facility] ° [address], [place] Phone: (336) 832-3150, Fax: (336) 832-3151. Hours of Operation:  8:30 am - 5:30 pm, M-F.  Also accepts Medicaid and self-pay.  °[facility] [place] [address], [place] Phone: (336) 878-6027   °[facility] [address], [place], [place] (336)723-1848, Ext. 123 Mondays & Thursdays: 7-9 AM.  First 15 patients are seen on a first come, first serve basis. °  ° °Medicaid-accepting [place] Providers: ° °Organization         Address  Phone   Notes  °[facility] [address], [place] (336) 641-2100 Also accepts self-pay patients.  °[facility] [address], [place] ° (336) 856-9996   °[facility] [address], [place]  (336) 288-8857   °[facility] [address] [place] [address], [place] (336) 299-7000   °Veita Bland [address], [place]  ° (336) 373-1557 Only accepts [place] Access Medicaid patients after they have their name applied to their card.  ° °Self-Pay (no insurance) in [place]: ° °Organization         Address  Phone   Notes  °Sickle Cell Patients, [facility] [address], [place] (336) 832-1970   °[facility] [address], [place] (336) 832-4400   °[facility] ° [address], [place] (336) 992-4800   °[facility]/Dr. Osei-Bonsu ° [address] [place] [address], [place] or [address], [place] (336) 841-8500 Phone number for both [place] and [place] locations is the same.  °[facility] [address], [place] (336) 299-0000   °[facility] [address] [place] [address], [place] or [address] (336) 852-7530 °(336) 878-2260   °[facility] [address], [place] (336) 350-1642, phone; (336) 294-5005, fax Sees patients 1st and 3rd Saturday of every month.  Must not qualify for public or private insurance (i.e. Medicaid, Medicare, [place] Health Choice, Veterans' Benefits) • Household income should be no more than 200% of the poverty level •The clinic cannot treat you if you are pregnant or think you are pregnant • Sexually transmitted diseases are not treated at the clinic.  ° ° °Dental Care: °Organization         Address  Phone  Notes  °[place] [facility] [address], [place] (336) 641-6152 Accepts children up to age 21 who are enrolled in Medicaid or [place] Health Choice; pregnant women with a Medicaid card; and children who have applied for Medicaid or [place] Health Choice, but were declined, whose parents can pay a reduced fee at time of service.  °[place]  [facility] [place]  [address], [place] (336) 641-7733 Accepts children up to age 21 who are enrolled in Medicaid or [place] Health Choice; pregnant women with a Medicaid card; and children who have applied for Medicaid or  Health Choice, but were declined, whose parents can pay a reduced fee at time of service.  °[facility] ° [address],  (336) 641-4533 Patients are seen by appointment only. Walk-ins are not accepted. [facility] will see patients 18 years of age and older. °Monday - Tuesday (8am-5pm) °Most Wednesdays (8:30-5pm) °$30 per visit, cash only  °[facility] ° [address], [place] (336) 641-4533 Patients are seen by appointment only. Walk-ins are not accepted. [facility] will see patients 18 years of age and older. °One   Wednesday Evening (Monthly: Volunteer Based).  $30 per visit, cash only  °[facility]  (919) 537-3737 for adults; Children under age 4, call Graduate Pediatric Dentistry at (919) [phone]. Children aged 4-14, please call (919) [phone] to request a pediatric application. ° Dental services are provided in all areas of dental care including fillings, crowns and bridges, complete and partial dentures, implants, gum treatment, root canals, and extractions. Preventive care is also provided. Treatment is provided to both adults and children. °Patients are selected via a lottery and there is often a waiting list. °  °[facility] [address], °[place] ° (336) 763-8833 [email or web address] °  °[facility] [address], [place], [place] (336)723-1848, Ext. 123 Second and Fourth Thursday of each month, opens at 6:30 AM; Clinic ends at 9 AM.  Patients are seen on a first-come first-served basis, and a limited number are seen during each clinic.  ° °[facility] ° [address], [place], [place] (336) 723-7904    Eligibility Requirements °You must have lived in [place], [place], or [place] counties for at least the last three months. °  You cannot be eligible for state or federal sponsored healthcare insurance, including Veterans Administration, Medicaid, or Medicare. °  You generally cannot be eligible for healthcare insurance through your employer.  °  How to apply: °Eligibility screenings are held every Tuesday and Wednesday afternoon from 1:00 pm until 4:00 pm. You do not need an appointment for the interview!  °[facility] [address], [place], [place] 336-631-2330   °[facility] [facility]  336-342-8273   °[facility]  336-703-3100   °[facility]  336-570-6415   ° °Behavioral Health Resources in the Community: °Intensive Outpatient Programs °Organization         Address  Phone  Notes  °[facility] [address], [place], [place] 336-878-6098   °[facility] [address], [place], [place] 336-832-9800   °[facility] [address], [place], [place] ° 336-882-2125   °[facility] [address],  °[place], [place] 1-800-853-5163 or 336-641-4981   °Substance Abuse Resources °Organization         Address  Phone  Notes  °[facility]  336-882-2125   °[facility]  336-784-9470   °[facility]  336-285-9073   °[facility]  336-845-3988   °Residential & Outpatient Substance Abuse Program  1-800-659-3381   °Psychological Services °Organization         Address  Phone  Notes  °[facility]  336- 832-9600   °[facility]  336- 378-7881   °[facility] [address], [place] 1-800-853-5163 or 336-641-4981   ° °Mobile Crisis Teams °Organization         Address  Phone  Notes  °[facility], Mobile Crisis Care Unit  1-877-626-1772   °Assertive °[facility] ° [address]  [place], [place] 336-834-9664   °Sharon DeEsch [address] °[place] 336-554-5454   ° °Self-Help/Support Groups °Organization         Address  Phone             Notes  °[facility] - variety of support groups  336- 373-1402 Call for more information  °Narcotics Anonymous (NA), [facility] [address], °[place]  2 meetings at this location  ° °  Residential Treatment Programs °Organization         Address  Phone  Notes  °[facility] [address],    °[place]  1-866-801-8205   °[facility] ° [address], [place], [place] 704-293-8524   °[facility] [address], [place] 336-845-3988 Admissions: 8am-3pm M-F  °[facility] [address]    °[place], [place] 336-841-1104   °[facility] [address], [place], [place] 336-379-7146   °[facility] [address]  °[place], [place] 336-285-9073   °[facility] Intensive Outpatient [address], [place], [place] 336-852-3033   °[facility] [address]  °[place], [place] 1-877-615-2722 or 336-784-9470   °[facility] [address] [place], [place] 336-227-7417 Accepts Medicaid  °[facility] [address]  °[place] 1-800-659-3381 Substance Abuse/Addiction Treatment  ° °[place] Behavioral Health Resources °Organization         Address  Phone  Notes  °[facility]  (888) 581-9988   °Julie Brannon, PhD [address] [place], [place]   (336) 349-5553 or (336) 951-0000   °[facility]   [address] °[place], [place] (336) 349-4454   °[facility] [address], [place], [place] (336) 342-8316 Insurance/Medicaid/sponsorship through [facility]  °[facility] [address]                                    [place], [place] (336) 342-8316 Therapy/tele-psych/case    °[facility] [address]  ° [place], [place] (336) 349-2233    °Dr. Arfeen  (336) 349-4544   °[facility] [place]  [employer] [place] [facility] 1) [address],  °2) [address], [place] °3)  [address], [place] (336) 349-3220 °(336) 342-7768 ° °(336) 342-8140   °[place] [facility] (336) 342-1394 or (336) 342-3537 (After Hours)    ° ° °
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
## 2015-10-18 NOTE — ED Notes (Signed)
Dr. Venora Maples and PA at bedside.

## 2015-10-27 DIAGNOSIS — Z0000 Encounter for general adult medical examination without abnormal findings: Secondary | ICD-10-CM | POA: Diagnosis not present

## 2015-10-27 DIAGNOSIS — Z01818 Encounter for other preprocedural examination: Secondary | ICD-10-CM | POA: Diagnosis not present

## 2015-10-27 DIAGNOSIS — I10 Essential (primary) hypertension: Secondary | ICD-10-CM | POA: Diagnosis not present

## 2015-10-30 DIAGNOSIS — F29 Unspecified psychosis not due to a substance or known physiological condition: Secondary | ICD-10-CM | POA: Diagnosis not present
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
## 2015-11-16 ENCOUNTER — Encounter (HOSPITAL_COMMUNITY): Payer: Self-pay | Admitting: *Deleted

## 2015-11-16 MED ORDER — CEFAZOLIN SODIUM-DEXTROSE 2-3 GM-% IV SOLR
2.0000 g | INTRAVENOUS | Status: AC
  Administered 2015-11-17: 2 g via INTRAVENOUS
  Filled 2015-11-16: qty 50

## 2015-11-16 NOTE — Progress Notes (Signed)
Pt denies cardiac history, chest pain or sob. Hx of alcohol dependence and drug use, pt did not like being asked about any of these things.   Requested EKG tracing from Nicholes Rough, [place] office. Report is in [place]

## 2015-11-17 ENCOUNTER — Encounter (HOSPITAL_COMMUNITY): Payer: Self-pay | Admitting: Surgery

## 2015-11-17 ENCOUNTER — Ambulatory Visit (HOSPITAL_COMMUNITY): Payer: Medicare Other | Admitting: Anesthesiology

## 2015-11-17 ENCOUNTER — Ambulatory Visit (HOSPITAL_COMMUNITY)
Admission: RE | Admit: 2015-11-17 | Discharge: 2015-11-17 | Disposition: A | Discharge status: Home / Self Care | Payer: Medicare Other | Source: Ambulatory Visit | Attending: Oral Surgery | Admitting: Oral Surgery

## 2015-11-17 ENCOUNTER — Encounter (HOSPITAL_COMMUNITY)
Admission: RE | Disposition: A | Discharge status: Home / Self Care | Payer: Self-pay | Source: Ambulatory Visit | Attending: Oral Surgery

## 2015-11-17 DIAGNOSIS — K0530 Chronic periodontitis, unspecified: Secondary | ICD-10-CM | POA: Diagnosis not present

## 2015-11-17 DIAGNOSIS — K029 Dental caries, unspecified: Secondary | ICD-10-CM | POA: Insufficient documentation

## 2015-11-17 DIAGNOSIS — E7800 Pure hypercholesterolemia, unspecified: Secondary | ICD-10-CM | POA: Insufficient documentation

## 2015-11-17 DIAGNOSIS — R0602 Shortness of breath: Secondary | ICD-10-CM | POA: Diagnosis not present

## 2015-11-17 DIAGNOSIS — Z79899 Other long term (current) drug therapy: Secondary | ICD-10-CM | POA: Diagnosis not present

## 2015-11-17 DIAGNOSIS — M270 Developmental disorders of jaws: Secondary | ICD-10-CM | POA: Insufficient documentation

## 2015-11-17 DIAGNOSIS — I10 Essential (primary) hypertension: Secondary | ICD-10-CM | POA: Diagnosis not present

## 2015-11-17 DIAGNOSIS — E039 Hypothyroidism, unspecified: Secondary | ICD-10-CM | POA: Insufficient documentation

## 2015-11-17 DIAGNOSIS — M109 Gout, unspecified: Secondary | ICD-10-CM | POA: Diagnosis not present

## 2015-11-17 DIAGNOSIS — Z87891 Personal history of nicotine dependence: Secondary | ICD-10-CM | POA: Insufficient documentation

## 2015-11-17 DIAGNOSIS — Z9181 History of falling: Secondary | ICD-10-CM | POA: Insufficient documentation

## 2015-11-17 DIAGNOSIS — F329 Major depressive disorder, single episode, unspecified: Secondary | ICD-10-CM | POA: Diagnosis not present

## 2015-11-17 DIAGNOSIS — E119 Type 2 diabetes mellitus without complications: Secondary | ICD-10-CM | POA: Diagnosis not present

## 2015-11-17 DIAGNOSIS — M1990 Unspecified osteoarthritis, unspecified site: Secondary | ICD-10-CM | POA: Diagnosis not present

## 2015-11-17 DIAGNOSIS — M549 Dorsalgia, unspecified: Secondary | ICD-10-CM | POA: Diagnosis not present

## 2015-11-17 HISTORY — PX: MULTIPLE EXTRACTIONS WITH ALVEOLOPLASTY: SHX5342

## 2015-11-17 HISTORY — DX: Anxiety disorder, unspecified: F41.9

## 2015-11-17 LAB — COMPREHENSIVE METABOLIC PANEL
ALK PHOS: 52 U/L (ref 38–126)
ALT: 14 U/L — ABNORMAL LOW (ref 17–63)
ANION GAP: 7 (ref 5–15)
AST: 21 U/L (ref 15–41)
Albumin: 3.5 g/dL (ref 3.5–5.0)
BUN: 8 mg/dL (ref 6–20)
CALCIUM: 8.7 mg/dL — AB (ref 8.9–10.3)
CO2: 26 mmol/L (ref 22–32)
Chloride: 104 mmol/L (ref 101–111)
Creatinine, Ser: 0.89 mg/dL (ref 0.61–1.24)
GFR calc non Af Amer: >60 mL/min (ref >60)
Glucose, Bld: 91 mg/dL (ref 65–99)
Potassium: 4.1 mmol/L (ref 3.5–5.1)
Sodium: 137 mmol/L (ref 135–145)
TOTAL PROTEIN: 7 g/dL (ref 6.5–8.1)
Total Bilirubin: 0.8 mg/dL (ref 0.3–1.2)

## 2015-11-17 LAB — HEMOGLOBIN: Hemoglobin: 12.2 g/dL — ABNORMAL LOW (ref 13.0–17.0)

## 2015-11-17 LAB — GLUCOSE, CAPILLARY: Glucose-Capillary: 112 mg/dL — ABNORMAL HIGH (ref 65–99)

## 2015-11-17 SURGERY — MULTIPLE EXTRACTION WITH ALVEOLOPLASTY
Anesthesia: General | Site: Mouth

## 2015-11-17 MED ORDER — SODIUM CHLORIDE 0.9 % IR SOLN
Status: DC | PRN
  Administered 2015-11-17: 1000 mL

## 2015-11-17 MED ORDER — PROPOFOL 10 MG/ML IV BOLUS
INTRAVENOUS | Status: DC | PRN
  Administered 2015-11-17: 170 mg via INTRAVENOUS
  Administered 2015-11-17: 30 mg via INTRAVENOUS

## 2015-11-17 MED ORDER — FENTANYL CITRATE (PF) 100 MCG/2ML IJ SOLN: 25.0000 ug | INTRAMUSCULAR | Status: DC | PRN

## 2015-11-17 MED ORDER — 0.9 % SODIUM CHLORIDE (POUR BTL) OPTIME
TOPICAL | Status: DC | PRN
  Administered 2015-11-17: 1000 mL

## 2015-11-17 MED ORDER — ONDANSETRON HCL 4 MG/2ML IJ SOLN
INTRAMUSCULAR | Status: DC | PRN
  Administered 2015-11-17: 4 mg via INTRAVENOUS

## 2015-11-17 MED ORDER — ESMOLOL HCL 100 MG/10ML IV SOLN
INTRAVENOUS | Status: DC | PRN
  Administered 2015-11-17: 50 mg via INTRAVENOUS
  Administered 2015-11-17: 20 mg via INTRAVENOUS

## 2015-11-17 MED ORDER — OXYCODONE-ACETAMINOPHEN 5-325 MG PO TABS: 1.0000 | ORAL_TABLET | ORAL | Status: DC | PRN

## 2015-11-17 MED ORDER — LIDOCAINE-EPINEPHRINE 2 %-1:100000 IJ SOLN
INTRAMUSCULAR | Status: DC | PRN
  Administered 2015-11-17: 18 mL

## 2015-11-17 MED ORDER — LIDOCAINE HCL (CARDIAC) 20 MG/ML IV SOLN
INTRAVENOUS | Status: DC | PRN
  Administered 2015-11-17: 80 mg via INTRAVENOUS

## 2015-11-17 MED ORDER — PROMETHAZINE HCL 25 MG/ML IJ SOLN
6.2500 mg | INTRAMUSCULAR | Status: DC | PRN
Start: 2015-11-17 — End: 2015-11-17

## 2015-11-17 MED ORDER — FENTANYL CITRATE (PF) 250 MCG/5ML IJ SOLN
INTRAMUSCULAR | Status: DC | PRN
  Administered 2015-11-17: 50 ug via INTRAVENOUS
  Administered 2015-11-17: 100 ug via INTRAVENOUS

## 2015-11-17 MED ORDER — FENTANYL CITRATE (PF) 250 MCG/5ML IJ SOLN
INTRAMUSCULAR | Status: AC
  Filled 2015-11-17: qty 5

## 2015-11-17 MED ORDER — PHENYLEPHRINE HCL 10 MG/ML IJ SOLN
INTRAMUSCULAR | Status: DC | PRN
Start: 2015-11-17 — End: 2015-11-17
  Administered 2015-11-17 (×2): 80 ug via INTRAVENOUS

## 2015-11-17 MED ORDER — LACTATED RINGERS IV SOLN
INTRAVENOUS | Status: DC | PRN
  Administered 2015-11-17 (×2): via INTRAVENOUS

## 2015-11-17 MED ORDER — SUCCINYLCHOLINE CHLORIDE 20 MG/ML IJ SOLN
INTRAMUSCULAR | Status: DC | PRN
  Administered 2015-11-17: 100 mg via INTRAVENOUS

## 2015-11-17 SURGICAL SUPPLY — 29 items
BUR CROSS CUT FISSURE 1.6 (BURR) ×2 IMPLANT
BUR CROSS CUT FISSURE 1.6MM (BURR) ×1
BUR EGG ELITE 4.0 (BURR) ×2 IMPLANT
BUR EGG ELITE 4.0MM (BURR) ×1
CANISTER SUCTION 2500CC (MISCELLANEOUS) ×3 IMPLANT
COVER SURGICAL LIGHT HANDLE (MISCELLANEOUS) ×3 IMPLANT
CRADLE DONUT ADULT HEAD (MISCELLANEOUS) ×3 IMPLANT
FLUID NSS /IRRIG 1000 ML XXX (MISCELLANEOUS) ×3 IMPLANT
GAUZE PACKING FOLDED 2  STR (GAUZE/BANDAGES/DRESSINGS) ×2
GAUZE PACKING FOLDED 2 STR (GAUZE/BANDAGES/DRESSINGS) ×1 IMPLANT
GLOVE BIO SURGEON STRL SZ 6.5 (GLOVE) ×2 IMPLANT
GLOVE BIO SURGEON STRL SZ7.5 (GLOVE) ×3 IMPLANT
GLOVE BIO SURGEONS STRL SZ 6.5 (GLOVE) ×1
GLOVE BIOGEL PI IND STRL 7.0 (GLOVE) ×1 IMPLANT
GLOVE BIOGEL PI INDICATOR 7.0 (GLOVE) ×2
GOWN STRL REUS W/ TWL LRG LVL3 (GOWN DISPOSABLE) ×1 IMPLANT
GOWN STRL REUS W/ TWL XL LVL3 (GOWN DISPOSABLE) ×1 IMPLANT
GOWN STRL REUS W/TWL LRG LVL3 (GOWN DISPOSABLE) ×2
GOWN STRL REUS W/TWL XL LVL3 (GOWN DISPOSABLE) ×2
KIT BASIN OR (CUSTOM PROCEDURE TRAY) ×3 IMPLANT
KIT ROOM TURNOVER OR (KITS) ×3 IMPLANT
NEEDLE 22X1 1/2 (OR ONLY) (NEEDLE) ×6 IMPLANT
NS IRRIG 1000ML POUR BTL (IV SOLUTION) ×3 IMPLANT
PAD ARMBOARD 7.5X6 YLW CONV (MISCELLANEOUS) ×6 IMPLANT
SUT CHROMIC 3 0 PS 2 (SUTURE) ×6 IMPLANT
SYR CONTROL 10ML LL (SYRINGE) ×6 IMPLANT
TRAY ENT MC OR (CUSTOM PROCEDURE TRAY) ×3 IMPLANT
TUBING IRRIGATION (MISCELLANEOUS) ×3 IMPLANT
YANKAUER SUCT BULB TIP NO VENT (SUCTIONS) ×3 IMPLANT

## 2015-11-17 NOTE — H&P (Signed)
H&P documentation  -History and Physical Reviewed  -Patient has been re-examined  -No change in the plan of care  Dakota Stewart M  

## 2015-11-17 NOTE — Transfer of Care (Signed)
Immediate Anesthesia Transfer of Care Note  Patient: Dakota Stewart  Procedure(s) Performed: Procedure(s): MULTIPLE EXTRACTION WITH ALVEOLOPLASTY (N/A)  Patient Location: PACU  Anesthesia Type:General  Level of Consciousness: sedated  Airway & Oxygen Therapy: Patient Spontanous Breathing and Patient connected to face mask oxygen  Post-op Assessment: Report given to RN and Post -op Vital signs reviewed and stable  Post vital signs: Reviewed and stable  Last Vitals:  Filed Vitals:   11/17/15 0758  BP: 142/87  Pulse: 58  Temp: 36.7 C  Resp: 18    Complications: No apparent anesthesia complications

## 2015-11-17 NOTE — Anesthesia Preprocedure Evaluation (Signed)
Anesthesia Evaluation  Patient identified by MRN, date of birth, ID band Patient awake    Reviewed: Allergy & Precautions, NPO status , Patient's Chart, lab work & pertinent test results  Airway Mallampati: II  TM Distance: >3 FB Neck ROM: Full    Dental   Pulmonary former smoker,    breath sounds clear to auscultation       Cardiovascular hypertension, Pt. on medications  Rhythm:Regular Rate:Normal     Neuro/Psych Anxiety Depression negative neurological ROS     GI/Hepatic negative GI ROS, (+) Hepatitis -  Endo/Other  diabetes, Type 2  Renal/GU negative Renal ROS     Musculoskeletal   Abdominal   Peds  Hematology negative hematology ROS (+)   Anesthesia Other Findings   Reproductive/Obstetrics                                Component Value Date/Time   WBC 6.1 09/27/2014 1216   RBC 4.14* 09/27/2014 1216   HGB 12.2* 11/17/2015 0804   HCT 46.0 09/27/2014 1236   PLT 160 09/27/2014 1216   LABPROT 14.6 09/27/2014 1216   INR 1.13 09/27/2014 1216   APTT 27 09/27/2014 1216      Component Value Date/Time   NA 137 11/17/2015 0804   K 4.1 11/17/2015 0804   CL 104 11/17/2015 0804   CO2 26 11/17/2015 0804   BUN 8 11/17/2015 0804   CREATININE 0.89 11/17/2015 0804   CREATININE 0.80 04/05/2014 1348   CALCIUM 8.7* 11/17/2015 0804   ALKPHOS 52 11/17/2015 0804   AST 21 11/17/2015 0804   ALT 14* 11/17/2015 0804   BILITOT 0.8 11/17/2015 0804      Anesthesia Physical Anesthesia Plan  ASA: III  Anesthesia Plan: General   Post-op Pain Management:    Induction: Intravenous  Airway Management Planned: Nasal ETT  Additional Equipment:   Intra-op Plan:   Post-operative Plan: Extubation in OR  Informed Consent: I have reviewed the patients History and Physical, chart, labs and discussed the procedure including the risks, benefits and alternatives for the proposed anesthesia with the  patient or authorized representative who has indicated his/her understanding and acceptance.     Plan Discussed with: CRNA  Anesthesia Plan Comments:         Anesthesia Quick Evaluation

## 2015-11-17 NOTE — Op Note (Signed)
11/17/2015  11:46 AM  PATIENT:  Dakota Stewart  69 y.o. male  PRE-OPERATIVE DIAGNOSIS:  NON RESTORABLE TEETH # 3, 6, 7, 8, 11, 15, 16, 21, 22, 23, 24, 25, 26, 27, 28, 29  POST-OPERATIVE DIAGNOSIS:NON RESTORABLE TEETH # 3, 6, 7, 8, 11, 15, 21, 22, 23, 24, 25, 26, 27, 28, 29    PROCEDURE:  Procedure(s): MULTIPLE EXTRACTION TEETH # 3, 6, 7, 8, 11, 15, 21, 22, 23, 24, 25, 26, 27, 28, 29 WITH ALVEOLOPLASTY  SURGEON:  Surgeon(s): Diona Browner, DDS  ANESTHESIA:   local and general  EBL:  minimal  DRAINS: none   SPECIMEN:  No Specimen  COUNTS:  YES  PLAN OF CARE: Discharge to home after PACU  PATIENT DISPOSITION:  PACU - hemodynamically stable.   PROCEDURE DETAILS: Dictation #   Gae Bon, DMD 11/17/2015 11:46 AM

## 2015-11-17 NOTE — Progress Notes (Signed)
Report given to teresa rn as caregiver

## 2015-11-17 NOTE — Anesthesia Postprocedure Evaluation (Signed)
Anesthesia Post Note  Patient: Dakota Stewart  Procedure(s) Performed: Procedure(s) (LRB): MULTIPLE EXTRACTION WITH ALVEOLOPLASTY (N/A)  Patient location during evaluation: PACU Anesthesia Type: General Level of consciousness: awake and alert Pain management: pain level controlled Vital Signs Assessment: post-procedure vital signs reviewed and stable Respiratory status: spontaneous breathing Cardiovascular status: blood pressure returned to baseline Anesthetic complications: no    Last Vitals:  Filed Vitals:   11/17/15 1245 11/17/15 1300  BP: 148/98 145/95  Pulse: 69 68  Temp:  36.5 C  Resp: 15 16    Last Pain:  Filed Vitals:   11/17/15 1338  PainSc: 0-No pain                 Tiajuana Amass

## 2015-11-17 NOTE — Anesthesia Procedure Notes (Signed)
Procedure Name: Intubation Date/Time: 11/17/2015 10:55 AM Performed by: Maude Leriche D Pre-anesthesia Checklist: Patient identified, Emergency Drugs available, Suction available, Patient being monitored and Timeout performed Patient Re-evaluated:Patient Re-evaluated prior to inductionOxygen Delivery Method: Circle system utilized Preoxygenation: Pre-oxygenation with 100% oxygen Intubation Type: IV induction Ventilation: Mask ventilation without difficulty Laryngoscope Size: Mac and 3 Grade View: Grade I Nasal Tubes: Magill forceps- large, utilized, Nasal prep performed and Right Tube size: 7.5 mm Number of attempts: 1 Placement Confirmation: ETT inserted through vocal cords under direct vision,  positive ETCO2 and breath sounds checked- equal and bilateral Secured at: 27 cm Tube secured with: Tape Dental Injury: Teeth and Oropharynx as per pre-operative assessment

## 2015-11-18 NOTE — Op Note (Signed)
NAME:  Dakota Stewart, Dakota Stewart            ACCOUNT NO.:  [id number]  MEDICAL RECORD NO.:  [id number]  LOCATION:  [facility]                         FACILITY:  [facility]  PHYSICIAN:  Gae Bon, M.D.  DATE OF BIRTH:  01/05/1946  DATE OF PROCEDURE:  11/17/2015 DATE OF DISCHARGE:  11/17/2015                              OPERATIVE REPORT   PREOPERATIVE DIAGNOSIS:  Nonrestorable teeth #3, 6, 7, 8, 11, 15, 16, 21, 22, 23, 24, 25, 26, 27, 28, 29.  POSTOPERATIVE DIAGNOSIS:  Nonrestorable teeth #3, 6, 7, 8, 11, 15, 16, 21, 22, 23, 24, 25, 26, 27, 28, 29.  Tooth #16, not present.  PROCEDURE:  Extraction of teeth #3, 6, 7, 8, 11, 15, 21, 22, 23, 24, 25, 26, 27, 28, 29.  Alveoplasty of right and left maxilla and mandible.  SURGEON:  Gae Bon, M.D.  ANESTHESIA:  General, nasal intubation.  DESCRIPTION OF PROCEDURE:  The patient was taken to the operating room and placed on the table in supine position.  General anesthesia was administered intravenously and a nasal endotracheal tube was placed and secured.  The eyes were protected.  The patient was draped for the procedure.  Time-out was performed.  The posterior pharynx was suctioned and a throat pack was placed.  A 2% lidocaine with 1:100,000 epinephrine was infiltrated in an inferior alveolar block on the right and left of the mandible and in buccal and palatal infiltration of the maxilla. Additional anesthetic solution was deposited in the anterior buccal region of the mandible, total of 18 mL was utilized.  A bite block was placed in the right side of the mouth and a sweetheart retractor was used to retract the tongue.  The left side was operated first.  A #15 blade was used to make an incision beginning at tooth #21, carrying forward to tooth #26 in the mandible on the buccal and lingual aspects in the mucogingival sulcus.  In the maxilla, incision was made around tooth #11, carried posteriorly to tooth #15 and distally to where tooth #16  was thought to be.  The periosteum was reflected in the maxilla and mandible on the left side and then the 301 elevator was used to elevate the lower teeth.  Teeth #21, 22, 23, 24, 25, 26 were removed with the dental forceps.  Tooth #26 had a root tip that fractured and it was retrieved using the root tip pick.  In the maxilla, tooth #15 was removed with dental forceps; however, tooth #16 was not present nor was any root of tooth #16.  Tooth #11 was removed using the dental forceps, but the tooth fractured upon removal and additional bone was removed using a Stryker handpiece with a fissure bur until the root was eventually removed.  Then, the periosteum was reflected and alveoplasty was performed in the left maxilla and mandible using the egg-shaped bur and bone file.  Then, the areas were irrigated and closed with 3-0 chromic.  The bite block and sweetheart retractor were repositioned to the other side of the mouth.  Then, a 15-blade was used to make incision around teeth #3 and carried forward to teeth #6, 7 and 8.  The periosteum was reflected in  this area and the teeth were elevated with a 301 elevator.  The teeth were removed then with the dental forceps.  The sockets were curetted and irrigated.  Then, the periosteum was reflected to expose the alveolar crest and the egg-shaped bur was used with the bone file to perform the alveoplasty.  Then, the areas were irrigated and closed with 3-0 chromic.  The oral cavity was then inspected, found to have good contour, hemostasis and closure.  The oral cavity was irrigated and suctioned.  The throat pack was removed.  The patient was awakened, taken to the recovery room, breathing spontaneously in good condition.  ESTIMATED BLOOD LOSS:  Minimum.  COMPLICATIONS:  None.  SPECIMENS:  None.     Gae Bon, M.D.     SMJ/MEDQ  D:  11/17/2015  T:  11/18/2015  Job:  [id number]

## 2015-11-20 ENCOUNTER — Encounter (HOSPITAL_COMMUNITY): Payer: Self-pay | Admitting: Oral Surgery

## 2015-11-22 ENCOUNTER — Inpatient Hospital Stay (HOSPITAL_COMMUNITY): Admission: RE | Admit: 2015-11-22 | Payer: Self-pay | Source: Ambulatory Visit

## 2015-11-29 DIAGNOSIS — F29 Unspecified psychosis not due to a substance or known physiological condition: Secondary | ICD-10-CM | POA: Diagnosis not present

## 2015-11-29 DIAGNOSIS — Z5181 Encounter for therapeutic drug level monitoring: Secondary | ICD-10-CM | POA: Diagnosis not present

## 2015-11-29 DIAGNOSIS — Z79899 Other long term (current) drug therapy: Secondary | ICD-10-CM | POA: Diagnosis not present

## 2015-12-28 DIAGNOSIS — F29 Unspecified psychosis not due to a substance or known physiological condition: Secondary | ICD-10-CM | POA: Diagnosis not present

## 2015-12-28 DIAGNOSIS — Z79899 Other long term (current) drug therapy: Secondary | ICD-10-CM | POA: Diagnosis not present

## 2015-12-28 DIAGNOSIS — Z5181 Encounter for therapeutic drug level monitoring: Secondary | ICD-10-CM | POA: Diagnosis not present

## 2016-01-25 DIAGNOSIS — F29 Unspecified psychosis not due to a substance or known physiological condition: Secondary | ICD-10-CM | POA: Diagnosis not present

## 2016-02-28 DIAGNOSIS — Z79899 Other long term (current) drug therapy: Secondary | ICD-10-CM | POA: Diagnosis not present

## 2016-02-28 DIAGNOSIS — Z5181 Encounter for therapeutic drug level monitoring: Secondary | ICD-10-CM | POA: Diagnosis not present

## 2016-02-28 DIAGNOSIS — F29 Unspecified psychosis not due to a substance or known physiological condition: Secondary | ICD-10-CM | POA: Diagnosis not present

## 2016-03-26 DIAGNOSIS — F29 Unspecified psychosis not due to a substance or known physiological condition: Secondary | ICD-10-CM | POA: Diagnosis not present

## 2016-04-04 DIAGNOSIS — Z79899 Other long term (current) drug therapy: Secondary | ICD-10-CM | POA: Diagnosis not present

## 2016-04-04 DIAGNOSIS — Z5181 Encounter for therapeutic drug level monitoring: Secondary | ICD-10-CM | POA: Diagnosis not present

## 2016-04-19 DIAGNOSIS — F29 Unspecified psychosis not due to a substance or known physiological condition: Secondary | ICD-10-CM | POA: Diagnosis not present

## 2016-05-24 DIAGNOSIS — F29 Unspecified psychosis not due to a substance or known physiological condition: Secondary | ICD-10-CM | POA: Diagnosis not present

## 2016-06-13 DIAGNOSIS — F29 Unspecified psychosis not due to a substance or known physiological condition: Secondary | ICD-10-CM | POA: Diagnosis not present

## 2016-06-19 DIAGNOSIS — Z79899 Other long term (current) drug therapy: Secondary | ICD-10-CM | POA: Diagnosis not present

## 2016-06-19 DIAGNOSIS — Z5181 Encounter for therapeutic drug level monitoring: Secondary | ICD-10-CM | POA: Diagnosis not present

## 2016-07-16 DIAGNOSIS — Z5181 Encounter for therapeutic drug level monitoring: Secondary | ICD-10-CM | POA: Diagnosis not present

## 2016-07-16 DIAGNOSIS — Z79899 Other long term (current) drug therapy: Secondary | ICD-10-CM | POA: Diagnosis not present

## 2016-08-28 DIAGNOSIS — F29 Unspecified psychosis not due to a substance or known physiological condition: Secondary | ICD-10-CM | POA: Diagnosis not present

## 2016-09-09 DIAGNOSIS — F29 Unspecified psychosis not due to a substance or known physiological condition: Secondary | ICD-10-CM | POA: Diagnosis not present

## 2016-09-10 DIAGNOSIS — Z5181 Encounter for therapeutic drug level monitoring: Secondary | ICD-10-CM | POA: Diagnosis not present

## 2016-09-10 DIAGNOSIS — Z79899 Other long term (current) drug therapy: Secondary | ICD-10-CM | POA: Diagnosis not present

## 2016-12-03 DIAGNOSIS — F29 Unspecified psychosis not due to a substance or known physiological condition: Secondary | ICD-10-CM | POA: Diagnosis not present

## 2016-12-06 DIAGNOSIS — F29 Unspecified psychosis not due to a substance or known physiological condition: Secondary | ICD-10-CM | POA: Diagnosis not present

## 2016-12-11 DIAGNOSIS — F29 Unspecified psychosis not due to a substance or known physiological condition: Secondary | ICD-10-CM | POA: Diagnosis not present

## 2016-12-20 DIAGNOSIS — F29 Unspecified psychosis not due to a substance or known physiological condition: Secondary | ICD-10-CM | POA: Diagnosis not present

## 2017-04-27 ENCOUNTER — Encounter (HOSPITAL_COMMUNITY): Payer: Self-pay | Admitting: Emergency Medicine

## 2017-04-27 ENCOUNTER — Emergency Department (HOSPITAL_COMMUNITY)
Admission: EM | Admit: 2017-04-27 | Discharge: 2017-04-27 | Disposition: A | Discharge status: Home / Self Care | Payer: Medicare Other | Attending: Emergency Medicine | Admitting: Emergency Medicine

## 2017-04-27 DIAGNOSIS — E039 Hypothyroidism, unspecified: Secondary | ICD-10-CM | POA: Insufficient documentation

## 2017-04-27 DIAGNOSIS — Z5321 Procedure and treatment not carried out due to patient leaving prior to being seen by health care provider: Secondary | ICD-10-CM | POA: Diagnosis not present

## 2017-04-27 DIAGNOSIS — E119 Type 2 diabetes mellitus without complications: Secondary | ICD-10-CM | POA: Diagnosis not present

## 2017-04-27 DIAGNOSIS — I10 Essential (primary) hypertension: Secondary | ICD-10-CM | POA: Diagnosis not present

## 2017-04-27 DIAGNOSIS — Z87891 Personal history of nicotine dependence: Secondary | ICD-10-CM | POA: Insufficient documentation

## 2017-04-27 DIAGNOSIS — M79641 Pain in right hand: Secondary | ICD-10-CM | POA: Insufficient documentation

## 2017-04-27 NOTE — ED Triage Notes (Signed)
Rt hand pain x 1 month has a spot that feels like something is in it
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
## 2017-04-27 NOTE — ED Notes (Signed)
Pt appears to have a  Callous on palm of right hand.

## 2017-04-27 NOTE — ED Notes (Signed)
Pt called RN into room, states "I been waiting all morning and nobody ain't seen me. Just let me out of here". Pt cursing as he was walking out. Explained to patient that the doctor was seeing the pt's that were here before him. Pt left.

## 2017-05-03 ENCOUNTER — Emergency Department (HOSPITAL_COMMUNITY)
Admission: EM | Admit: 2017-05-03 | Discharge: 2017-05-03 | Disposition: A | Discharge status: Home / Self Care | Payer: Medicare Other | Attending: Emergency Medicine | Admitting: Emergency Medicine

## 2017-05-03 DIAGNOSIS — E119 Type 2 diabetes mellitus without complications: Secondary | ICD-10-CM | POA: Insufficient documentation

## 2017-05-03 DIAGNOSIS — M79641 Pain in right hand: Secondary | ICD-10-CM | POA: Diagnosis present

## 2017-05-03 DIAGNOSIS — Z87891 Personal history of nicotine dependence: Secondary | ICD-10-CM | POA: Diagnosis not present

## 2017-05-03 DIAGNOSIS — L84 Corns and callosities: Secondary | ICD-10-CM | POA: Diagnosis not present

## 2017-05-03 DIAGNOSIS — I10 Essential (primary) hypertension: Secondary | ICD-10-CM | POA: Diagnosis not present

## 2017-05-03 NOTE — Discharge Instructions (Signed)
Please call [facility] to make an appointment to see one of their healthcare providers. You can schedule an appointments. Please keep your hand clean with warm soap and water. You can apply a bandage over the hand to keep the callous from worsening. If skin becomes red, hot, or it swell, you can return to the Emergency Department for re-evaluation. There are also several over the counter products that you can buy at the pharmacy to help with your hand.

## 2017-05-03 NOTE — ED Triage Notes (Signed)
Pt c/o 8/10 right hand pain, denies any injury.
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
## 2017-05-08 ENCOUNTER — Ambulatory Visit: Payer: Medicare Other | Attending: Internal Medicine | Admitting: Physician Assistant

## 2017-05-08 ENCOUNTER — Encounter: Payer: Self-pay | Admitting: General Practice

## 2017-05-08 ENCOUNTER — Encounter: Payer: Self-pay | Admitting: Physician Assistant

## 2017-05-08 VITALS — BP 121/87 | HR 73 | Temp 98.0°F | Resp 16 | Wt 136.4 lb

## 2017-05-08 DIAGNOSIS — H539 Unspecified visual disturbance: Secondary | ICD-10-CM | POA: Diagnosis not present

## 2017-05-08 DIAGNOSIS — I10 Essential (primary) hypertension: Secondary | ICD-10-CM | POA: Diagnosis not present

## 2017-05-08 DIAGNOSIS — Z09 Encounter for follow-up examination after completed treatment for conditions other than malignant neoplasm: Secondary | ICD-10-CM | POA: Insufficient documentation

## 2017-05-08 DIAGNOSIS — F1290 Cannabis use, unspecified, uncomplicated: Secondary | ICD-10-CM | POA: Diagnosis not present

## 2017-05-08 DIAGNOSIS — L84 Corns and callosities: Secondary | ICD-10-CM

## 2017-05-08 DIAGNOSIS — E118 Type 2 diabetes mellitus with unspecified complications: Secondary | ICD-10-CM | POA: Diagnosis not present

## 2017-05-08 DIAGNOSIS — Z13220 Encounter for screening for lipoid disorders: Secondary | ICD-10-CM | POA: Diagnosis not present

## 2017-05-08 DIAGNOSIS — F329 Major depressive disorder, single episode, unspecified: Secondary | ICD-10-CM | POA: Diagnosis not present

## 2017-05-08 DIAGNOSIS — M109 Gout, unspecified: Secondary | ICD-10-CM | POA: Diagnosis not present

## 2017-05-08 DIAGNOSIS — E7800 Pure hypercholesterolemia, unspecified: Secondary | ICD-10-CM | POA: Diagnosis not present

## 2017-05-08 DIAGNOSIS — F419 Anxiety disorder, unspecified: Secondary | ICD-10-CM | POA: Diagnosis not present

## 2017-05-08 DIAGNOSIS — E039 Hypothyroidism, unspecified: Secondary | ICD-10-CM | POA: Diagnosis not present

## 2017-05-08 LAB — GLUCOSE, POCT (MANUAL RESULT ENTRY): POC GLUCOSE: 149 mg/dL — AB (ref 70–99)

## 2017-05-08 LAB — POCT GLYCOSYLATED HEMOGLOBIN (HGB A1C): Hemoglobin A1C: 5.4
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
## 2017-05-09 ENCOUNTER — Other Ambulatory Visit: Payer: Self-pay | Admitting: Physician Assistant

## 2017-05-09 DIAGNOSIS — E039 Hypothyroidism, unspecified: Secondary | ICD-10-CM

## 2017-05-09 LAB — COMPREHENSIVE METABOLIC PANEL
ALK PHOS: 76 IU/L (ref 39–117)
ALT: 12 IU/L (ref 0–44)
AST: 19 IU/L (ref 0–40)
Albumin/Globulin Ratio: 1 — ABNORMAL LOW (ref 1.2–2.2)
Albumin: 3.9 g/dL (ref 3.5–4.8)
BUN / CREAT RATIO: 13 (ref 10–24)
BUN: 11 mg/dL (ref 8–27)
Bilirubin Total: 0.6 mg/dL (ref 0.0–1.2)
CO2: 22 mmol/L (ref 18–29)
CREATININE: 0.83 mg/dL (ref 0.76–1.27)
Calcium: 9.3 mg/dL (ref 8.6–10.2)
Chloride: 101 mmol/L (ref 96–106)
GFR calc Af Amer: 101 mL/min/{1.73_m2} (ref >59)
GFR calc non Af Amer: 87 mL/min/{1.73_m2} (ref >59)
GLOBULIN, TOTAL: 3.9 g/dL (ref 1.5–4.5)
Glucose: 127 mg/dL — ABNORMAL HIGH (ref 65–99)
POTASSIUM: 3.7 mmol/L (ref 3.5–5.2)
SODIUM: 138 mmol/L (ref 134–144)
Total Protein: 7.8 g/dL (ref 6.0–8.5)

## 2017-05-09 LAB — LDL CHOLESTEROL, DIRECT: LDL Direct: 62 mg/dL (ref 0–99)

## 2017-05-09 LAB — TSH: TSH: 25.03 u[IU]/mL — AB (ref 0.450–4.500)

## 2017-05-09 MED ORDER — LEVOTHYROXINE SODIUM 75 MCG PO TABS: 75.0000 ug | ORAL_TABLET | Freq: Every day | ORAL | 3 refills | Status: DC

## 2017-05-12 ENCOUNTER — Telehealth: Payer: Self-pay | Admitting: *Deleted

## 2017-05-12 NOTE — Telephone Encounter (Signed)
-----   Message from Argentina Donovan, [place] sent at 05/09/2017  9:45 AM EDT ----- Please call patient.  He definitely needs to restart his medication for his thyroid.  I sent him a prescription to our pharmacy and we will recheck this in about 2 months.    Thanks, Freeman Caldron, PA-C

## 2017-05-12 NOTE — Telephone Encounter (Signed)
Medical Assistant left message on patient's home and cell voicemail. Voicemail states to give a call back to [place] with [facility] at [phone].   !!!Please inform patient of thyroid medication needing to be restarted and levels rechecked in 2 months. Prescription has been sent!!!!

## 2017-05-20 DIAGNOSIS — Z961 Presence of intraocular lens: Secondary | ICD-10-CM | POA: Diagnosis not present

## 2017-05-20 DIAGNOSIS — H43811 Vitreous degeneration, right eye: Secondary | ICD-10-CM | POA: Diagnosis not present

## 2017-05-20 DIAGNOSIS — E119 Type 2 diabetes mellitus without complications: Secondary | ICD-10-CM | POA: Diagnosis not present

## 2017-05-20 DIAGNOSIS — H35373 Puckering of macula, bilateral: Secondary | ICD-10-CM | POA: Diagnosis not present

## 2017-05-20 DIAGNOSIS — H2511 Age-related nuclear cataract, right eye: Secondary | ICD-10-CM | POA: Diagnosis not present

## 2017-05-20 DIAGNOSIS — H05422 Enophthalmos due to trauma or surgery, left eye: Secondary | ICD-10-CM | POA: Diagnosis not present

## 2017-05-20 DIAGNOSIS — H5010 Unspecified exotropia: Secondary | ICD-10-CM | POA: Diagnosis not present

## 2017-05-29 ENCOUNTER — Ambulatory Visit: Payer: Medicare Other | Admitting: Internal Medicine

## 2017-05-29 DIAGNOSIS — H532 Diplopia: Secondary | ICD-10-CM | POA: Diagnosis not present

## 2017-05-29 DIAGNOSIS — H05422 Enophthalmos due to trauma or surgery, left eye: Secondary | ICD-10-CM | POA: Diagnosis not present

## 2017-05-29 DIAGNOSIS — S0993XA Unspecified injury of face, initial encounter: Secondary | ICD-10-CM | POA: Diagnosis not present

## 2017-06-01 ENCOUNTER — Emergency Department (HOSPITAL_COMMUNITY)
Admission: EM | Admit: 2017-06-01 | Discharge: 2017-06-01 | Disposition: A | Discharge status: Home / Self Care | Payer: Medicare Other | Attending: Emergency Medicine | Admitting: Emergency Medicine

## 2017-06-01 ENCOUNTER — Encounter (HOSPITAL_COMMUNITY): Payer: Self-pay | Admitting: Emergency Medicine

## 2017-06-01 DIAGNOSIS — L84 Corns and callosities: Secondary | ICD-10-CM | POA: Diagnosis not present

## 2017-06-01 DIAGNOSIS — Z87891 Personal history of nicotine dependence: Secondary | ICD-10-CM | POA: Diagnosis not present

## 2017-06-01 DIAGNOSIS — M79641 Pain in right hand: Secondary | ICD-10-CM | POA: Diagnosis present

## 2017-06-01 DIAGNOSIS — Z79899 Other long term (current) drug therapy: Secondary | ICD-10-CM | POA: Diagnosis not present

## 2017-06-01 DIAGNOSIS — E039 Hypothyroidism, unspecified: Secondary | ICD-10-CM | POA: Diagnosis not present

## 2017-06-01 DIAGNOSIS — E119 Type 2 diabetes mellitus without complications: Secondary | ICD-10-CM | POA: Diagnosis not present

## 2017-06-01 DIAGNOSIS — I10 Essential (primary) hypertension: Secondary | ICD-10-CM | POA: Diagnosis not present

## 2017-06-01 NOTE — Discharge Instructions (Signed)
Please apply vaseline to your hand callus nightly to help it heal.  Wear gloves when riding your bike.  Follow up with your doctor for further care.

## 2017-06-01 NOTE — ED Triage Notes (Signed)
Pt states he is having 9/10 hand pain for over a week was seen here 2 weeks ago, but he still having the same pain.
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
## 2017-06-03 ENCOUNTER — Encounter: Payer: Self-pay | Admitting: Internal Medicine

## 2017-06-03 ENCOUNTER — Ambulatory Visit: Payer: Medicare Other | Attending: Internal Medicine | Admitting: Internal Medicine

## 2017-06-03 VITALS — BP 116/69 | HR 68 | Temp 98.7°F | Resp 18 | Ht 67.0 in | Wt 133.8 lb

## 2017-06-03 DIAGNOSIS — E039 Hypothyroidism, unspecified: Secondary | ICD-10-CM

## 2017-06-03 DIAGNOSIS — L84 Corns and callosities: Secondary | ICD-10-CM

## 2017-06-03 MED FILL — ?LEVOTHYROXINE 75 MCG TABLE: 75 | 30 days supply | Qty: 30 | Fill #0
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
## 2017-06-03 NOTE — Patient Instructions (Signed)
You have been referred to a dermatologist for the callus on your hand.  Please pick up the medication from our pharmacy for your thyroid and take daily as instructed.

## 2017-06-03 NOTE — Addendum Note (Signed)
Addended by: Karle Plumber B on: 06/03/2017 01:19 PM   Modules accepted: Orders

## 2017-06-05 ENCOUNTER — Encounter: Payer: Self-pay | Admitting: Internal Medicine

## 2017-08-04 ENCOUNTER — Ambulatory Visit: Payer: Medicare Other | Admitting: Internal Medicine

## 2017-11-13 ENCOUNTER — Ambulatory Visit: Payer: Medicare Other | Attending: Internal Medicine | Admitting: Internal Medicine

## 2017-11-13 ENCOUNTER — Encounter: Payer: Self-pay | Admitting: Internal Medicine

## 2017-11-13 VITALS — BP 105/68 | HR 76 | Temp 98.3°F | Resp 18 | Ht 67.0 in | Wt 142.6 lb

## 2017-11-13 DIAGNOSIS — E039 Hypothyroidism, unspecified: Secondary | ICD-10-CM | POA: Diagnosis not present

## 2017-11-13 DIAGNOSIS — G4700 Insomnia, unspecified: Secondary | ICD-10-CM | POA: Diagnosis not present

## 2017-11-13 DIAGNOSIS — H547 Unspecified visual loss: Secondary | ICD-10-CM | POA: Insufficient documentation

## 2017-11-13 DIAGNOSIS — Z87891 Personal history of nicotine dependence: Secondary | ICD-10-CM | POA: Diagnosis not present

## 2017-11-13 DIAGNOSIS — L97509 Non-pressure chronic ulcer of other part of unspecified foot with unspecified severity: Secondary | ICD-10-CM | POA: Insufficient documentation

## 2017-11-13 DIAGNOSIS — E118 Type 2 diabetes mellitus with unspecified complications: Secondary | ICD-10-CM

## 2017-11-13 DIAGNOSIS — L98491 Non-pressure chronic ulcer of skin of other sites limited to breakdown of skin: Secondary | ICD-10-CM

## 2017-11-13 DIAGNOSIS — Z9889 Other specified postprocedural states: Secondary | ICD-10-CM | POA: Diagnosis not present

## 2017-11-13 DIAGNOSIS — R7303 Prediabetes: Secondary | ICD-10-CM | POA: Insufficient documentation

## 2017-11-13 DIAGNOSIS — I10 Essential (primary) hypertension: Secondary | ICD-10-CM | POA: Diagnosis not present

## 2017-11-13 DIAGNOSIS — Z79899 Other long term (current) drug therapy: Secondary | ICD-10-CM | POA: Diagnosis not present

## 2017-11-13 LAB — POCT GLYCOSYLATED HEMOGLOBIN (HGB A1C): HEMOGLOBIN A1C: 5.4

## 2017-11-13 LAB — POCT CBG (FASTING - GLUCOSE)-MANUAL ENTRY: GLUCOSE FASTING, POC: 113 mg/dL — AB (ref 70–99)

## 2017-11-13 MED ORDER — TRIAMCINOLONE ACETONIDE 0.1 % EX CREA: 1.0000 "application " | TOPICAL_CREAM | Freq: Two times a day (BID) | CUTANEOUS | 0 refills | Status: AC

## 2017-11-13 MED ORDER — LEVOTHYROXINE SODIUM 75 MCG PO TABS: 75.0000 ug | ORAL_TABLET | Freq: Every day | ORAL | 3 refills | Status: AC

## 2017-11-13 MED ORDER — MELATONIN 3 MG PO CAPS: 1.0000 | ORAL_CAPSULE | Freq: Every evening | ORAL | 1 refills | Status: AC | PRN

## 2017-11-13 MED FILL — ?LEVOTHYROXINE 75 MCG TABLE: 75 | 30 days supply | Qty: 30 | Fill #0
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
## 2018-01-12 ENCOUNTER — Ambulatory Visit: Payer: Medicare Other | Attending: Internal Medicine | Admitting: *Deleted

## 2018-01-12 VITALS — BP 105/74 | HR 71 | Temp 97.3°F | Resp 16

## 2018-01-12 DIAGNOSIS — L98491 Non-pressure chronic ulcer of skin of other sites limited to breakdown of skin: Secondary | ICD-10-CM

## 2018-03-31 NOTE — Progress Notes (Signed)
Pt has concerns about hard callus on hand. He was informed that PCP referred him to dermatologist for f/u.

## 2018-10-15 ENCOUNTER — Encounter (HOSPITAL_COMMUNITY): Payer: Self-pay | Admitting: Emergency Medicine

## 2018-10-15 ENCOUNTER — Emergency Department (HOSPITAL_COMMUNITY): Payer: Medicare Other

## 2018-10-15 ENCOUNTER — Emergency Department (HOSPITAL_COMMUNITY)
Admission: EM | Admit: 2018-10-15 | Discharge: 2018-10-15 | Disposition: A | Discharge status: Home / Self Care | Payer: Medicare Other | Attending: Emergency Medicine | Admitting: Emergency Medicine

## 2018-10-15 ENCOUNTER — Other Ambulatory Visit: Payer: Self-pay

## 2018-10-15 DIAGNOSIS — R918 Other nonspecific abnormal finding of lung field: Secondary | ICD-10-CM | POA: Insufficient documentation

## 2018-10-15 DIAGNOSIS — R05 Cough: Secondary | ICD-10-CM | POA: Diagnosis present

## 2018-10-15 DIAGNOSIS — E039 Hypothyroidism, unspecified: Secondary | ICD-10-CM | POA: Insufficient documentation

## 2018-10-15 DIAGNOSIS — F329 Major depressive disorder, single episode, unspecified: Secondary | ICD-10-CM | POA: Insufficient documentation

## 2018-10-15 DIAGNOSIS — F419 Anxiety disorder, unspecified: Secondary | ICD-10-CM | POA: Insufficient documentation

## 2018-10-15 DIAGNOSIS — Z87891 Personal history of nicotine dependence: Secondary | ICD-10-CM | POA: Diagnosis not present

## 2018-10-15 DIAGNOSIS — I10 Essential (primary) hypertension: Secondary | ICD-10-CM | POA: Diagnosis not present

## 2018-10-15 DIAGNOSIS — E119 Type 2 diabetes mellitus without complications: Secondary | ICD-10-CM | POA: Diagnosis not present

## 2018-10-15 LAB — CBC WITH DIFFERENTIAL/PLATELET
ABS IMMATURE GRANULOCYTES: 0.03 10*3/uL (ref 0.00–0.07)
Basophils Absolute: 0 10*3/uL (ref 0.0–0.1)
Basophils Relative: 0 %
Eosinophils Absolute: 0.1 10*3/uL (ref 0.0–0.5)
Eosinophils Relative: 1 %
HCT: 29.2 % — ABNORMAL LOW (ref 39.0–52.0)
HEMOGLOBIN: 9 g/dL — AB (ref 13.0–17.0)
Immature Granulocytes: 0 %
Lymphocytes Relative: 22 %
Lymphs Abs: 2.4 10*3/uL (ref 0.7–4.0)
MCH: 27.7 pg (ref 26.0–34.0)
MCHC: 30.8 g/dL (ref 30.0–36.0)
MCV: 89.8 fL (ref 80.0–100.0)
MONO ABS: 0.9 10*3/uL (ref 0.1–1.0)
MONOS PCT: 9 %
NEUTROS ABS: 7.3 10*3/uL (ref 1.7–7.7)
Neutrophils Relative %: 68 %
Platelets: 398 10*3/uL (ref 150–400)
RBC: 3.25 MIL/uL — ABNORMAL LOW (ref 4.22–5.81)
RDW: 14.6 % (ref 11.5–15.5)
WBC: 10.7 10*3/uL — ABNORMAL HIGH (ref 4.0–10.5)
nRBC: 0 % (ref 0.0–0.2)

## 2018-10-15 LAB — BASIC METABOLIC PANEL
Anion gap: 8 (ref 5–15)
BUN: 8 mg/dL (ref 8–23)
CO2: 24 mmol/L (ref 22–32)
Calcium: 9.5 mg/dL (ref 8.9–10.3)
Chloride: 101 mmol/L (ref 98–111)
Creatinine, Ser: 0.53 mg/dL — ABNORMAL LOW (ref 0.61–1.24)
GFR calc Af Amer: >60 mL/min (ref >60)
GFR calc non Af Amer: >60 mL/min (ref >60)
GLUCOSE: 96 mg/dL (ref 70–99)
POTASSIUM: 3 mmol/L — AB (ref 3.5–5.1)
Sodium: 133 mmol/L — ABNORMAL LOW (ref 135–145)

## 2018-10-15 MED ORDER — IOHEXOL 300 MG/ML  SOLN
75.0000 mL | Freq: Once | INTRAMUSCULAR | Status: AC | PRN
  Administered 2018-10-15: 75 mL via INTRAVENOUS

## 2018-10-15 MED ORDER — POTASSIUM CHLORIDE CRYS ER 20 MEQ PO TBCR
40.0000 meq | EXTENDED_RELEASE_TABLET | Freq: Once | ORAL | Status: AC
  Administered 2018-10-15: 40 meq via ORAL
  Filled 2018-10-15: qty 2
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
## 2018-10-15 NOTE — ED Notes (Signed)
Patient transported to X-ray 
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
## 2018-10-15 NOTE — ED Notes (Signed)
PT returns from CT. PT continues to be disgruntled about visit time. PT is allowed to sit in chair as opposed to bed. PT aware we are waiting for CT results. PT states, " I should have just gone home and died." PT refuses offers of food and drink.

## 2018-10-15 NOTE — ED Triage Notes (Signed)
PT reports cough for 3 weeks. Back pain related to cough per patient. PT reports "rattling in chest" when he coughs. Denies chest pain.

## 2018-10-15 NOTE — ED Notes (Signed)
PT made aware that we are waiting for blood work results to ensure that it is safe to send him to CT. PT is calm and receptive. PT says, "That's fine I'm just tired of laying here. "  PT offered food, drink, additional blankets, and TV, PT refuses all

## 2018-10-15 NOTE — Discharge Instructions (Addendum)
The tests indicate that you have a mass in your right lung.  This could be a cancer.  It needs follow-up with a pulmonologist.  We have set an appointment for you to see the pulmonologist on October 29, 2018, at 10 AM.  This appointment is at [facility], the address and phone number listed here.  Make sure that you show up at least 15 minutes prior to the appointment.

## 2018-10-29 ENCOUNTER — Encounter: Payer: Self-pay | Admitting: Pulmonary Disease

## 2018-10-29 ENCOUNTER — Ambulatory Visit (INDEPENDENT_AMBULATORY_CARE_PROVIDER_SITE_OTHER): Payer: Medicare Other | Admitting: Pulmonary Disease

## 2018-10-29 ENCOUNTER — Other Ambulatory Visit: Payer: Self-pay | Admitting: Pulmonary Disease

## 2018-10-29 VITALS — BP 126/70 | HR 80 | Ht 67.0 in | Wt 127.4 lb

## 2018-10-29 DIAGNOSIS — R05 Cough: Secondary | ICD-10-CM

## 2018-10-29 DIAGNOSIS — R9389 Abnormal findings on diagnostic imaging of other specified body structures: Secondary | ICD-10-CM

## 2018-10-29 DIAGNOSIS — R059 Cough, unspecified: Secondary | ICD-10-CM

## 2018-10-29 LAB — PROTEIN, TOTAL: TOTAL PROTEIN: 8.8 g/dL — AB (ref 6.0–8.3)

## 2018-10-29 MED ORDER — BENZONATATE 200 MG PO CAPS: 200.0000 mg | ORAL_CAPSULE | Freq: Three times a day (TID) | ORAL | 1 refills | Status: AC | PRN

## 2018-10-29 MED ORDER — ALBUTEROL SULFATE HFA 108 (90 BASE) MCG/ACT IN AERS: 2.0000 | INHALATION_SPRAY | Freq: Four times a day (QID) | RESPIRATORY_TRACT | 6 refills | Status: AC | PRN

## 2018-10-29 MED ORDER — DOXYCYCLINE HYCLATE 100 MG PO TABS: 100.0000 mg | ORAL_TABLET | Freq: Two times a day (BID) | ORAL | 0 refills | Status: AC

## 2018-10-29 MED FILL — ALBUTEROL SULFATE HFA 108 (: 108 (90 BAS | 25 days supply | Qty: 18 | Fill #0

## 2018-10-29 MED FILL — DOXYCYCLINE HYCLATE 100 MG: 100 | 7 days supply | Qty: 14 | Fill #0

## 2018-10-29 NOTE — Patient Instructions (Signed)
Abnormal CT scan of the chest  Chronic cough  Weight loss, reduced appetite  Concern for lung cancer  We will obtain fluid from around the lung to send for analysis  We will plan for bronchoscopy-scope to your breathing tube into the area looks abnormal to get biopsy results  Antibiotics, cough medicine for symptom relief  Discussions about definitely quitting smoking/vaping  I will see you back in the office in about 4 to 6 weeks Call with significant concerns
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
## 2018-10-30 ENCOUNTER — Ambulatory Visit (HOSPITAL_COMMUNITY)
Admission: RE | Admit: 2018-10-30 | Discharge: 2018-10-30 | Disposition: A | Discharge status: Home / Self Care | Payer: Medicare Other | Source: Ambulatory Visit | Attending: Radiology | Admitting: Radiology

## 2018-10-30 ENCOUNTER — Ambulatory Visit (HOSPITAL_COMMUNITY)
Admission: RE | Admit: 2018-10-30 | Discharge: 2018-10-30 | Disposition: A | Discharge status: Home / Self Care | Payer: Medicare Other | Source: Ambulatory Visit | Attending: Pulmonary Disease | Admitting: Pulmonary Disease

## 2018-10-30 DIAGNOSIS — J90 Pleural effusion, not elsewhere classified: Secondary | ICD-10-CM | POA: Diagnosis present

## 2018-10-30 DIAGNOSIS — R9389 Abnormal findings on diagnostic imaging of other specified body structures: Secondary | ICD-10-CM

## 2018-10-30 DIAGNOSIS — Z9889 Other specified postprocedural states: Secondary | ICD-10-CM

## 2018-10-30 DIAGNOSIS — E7800 Pure hypercholesterolemia, unspecified: Secondary | ICD-10-CM | POA: Diagnosis not present

## 2018-10-30 LAB — BODY FLUID CELL COUNT WITH DIFFERENTIAL
Eos, Fluid: 3 %
LYMPHS FL: 7 %
Monocyte-Macrophage-Serous Fluid: 59 % (ref 50–90)
NEUTROPHIL FLUID: 31 % — AB (ref 0–25)
WBC FLUID: 857 uL (ref 0–1000)

## 2018-10-30 LAB — LACTATE DEHYDROGENASE, PLEURAL OR PERITONEAL FLUID: LD, Fluid: 130 U/L — ABNORMAL HIGH (ref 3–23)

## 2018-10-30 LAB — GLUCOSE, PLEURAL OR PERITONEAL FLUID: Glucose, Fluid: 157 mg/dL

## 2018-10-30 LAB — AMYLASE, PLEURAL OR PERITONEAL FLUID: AMYLASE FL: 73 U/L

## 2018-10-30 LAB — PROTEIN, PLEURAL OR PERITONEAL FLUID: TOTAL PROTEIN, FLUID: 5.3 g/dL

## 2018-10-30 LAB — LACTATE DEHYDROGENASE: LDH: 127 U/L (ref 120–250)

## 2018-10-30 MED ORDER — LIDOCAINE HCL 1 % IJ SOLN
INTRAMUSCULAR | Status: AC
  Filled 2018-10-30: qty 10

## 2018-10-30 NOTE — Procedures (Signed)
Ultrasound-guided diagnostic and therapeutic right thoracentesis performed yielding 670 cc of slightly hazy, yellow fluid. No immediate complications. Follow-up chest x-ray pending. The fluid was sent to the lab for preordered studies.

## 2018-10-31 LAB — ACID FAST SMEAR (AFB, MYCOBACTERIA): Acid Fast Smear: NEGATIVE

## 2018-11-02 LAB — PH, BODY FLUID: pH, Body Fluid: 7.5

## 2018-11-02 LAB — BODY FLUID CULTURE: CULTURE: NO GROWTH

## 2018-11-05 ENCOUNTER — Encounter (HOSPITAL_COMMUNITY): Payer: Self-pay | Admitting: Pulmonary Disease

## 2018-11-05 LAB — CHOLESTEROL, BODY FLUID: CHOL FL: 92 mg/dL

## 2018-11-06 ENCOUNTER — Ambulatory Visit (HOSPITAL_COMMUNITY): Payer: Medicare Other

## 2018-11-06 ENCOUNTER — Ambulatory Visit (HOSPITAL_COMMUNITY)
Admission: RE | Admit: 2018-11-06 | Discharge: 2018-11-06 | Disposition: A | Discharge status: Home / Self Care | Payer: Medicare Other | Source: Ambulatory Visit | Attending: Pulmonary Disease | Admitting: Pulmonary Disease

## 2018-11-06 ENCOUNTER — Encounter (HOSPITAL_COMMUNITY)
Admission: RE | Disposition: A | Discharge status: Home / Self Care | Payer: Self-pay | Source: Ambulatory Visit | Attending: Pulmonary Disease

## 2018-11-06 ENCOUNTER — Telehealth: Payer: Self-pay | Admitting: Pulmonary Disease

## 2018-11-06 DIAGNOSIS — E119 Type 2 diabetes mellitus without complications: Secondary | ICD-10-CM | POA: Insufficient documentation

## 2018-11-06 DIAGNOSIS — Z79890 Hormone replacement therapy (postmenopausal): Secondary | ICD-10-CM | POA: Diagnosis not present

## 2018-11-06 DIAGNOSIS — C3431 Malignant neoplasm of lower lobe, right bronchus or lung: Secondary | ICD-10-CM | POA: Diagnosis not present

## 2018-11-06 DIAGNOSIS — Z79899 Other long term (current) drug therapy: Secondary | ICD-10-CM | POA: Diagnosis not present

## 2018-11-06 DIAGNOSIS — R918 Other nonspecific abnormal finding of lung field: Secondary | ICD-10-CM

## 2018-11-06 DIAGNOSIS — I10 Essential (primary) hypertension: Secondary | ICD-10-CM | POA: Diagnosis not present

## 2018-11-06 DIAGNOSIS — Z9889 Other specified postprocedural states: Secondary | ICD-10-CM

## 2018-11-06 HISTORY — PX: VIDEO BRONCHOSCOPY: SHX5072

## 2018-11-06 SURGERY — BRONCHOSCOPY, WITH FLUOROSCOPY
Anesthesia: Moderate Sedation | Laterality: Bilateral

## 2018-11-06 MED ORDER — MIDAZOLAM HCL (PF) 5 MG/ML IJ SOLN
INTRAMUSCULAR | Status: AC
  Filled 2018-11-06: qty 2

## 2018-11-06 MED ORDER — PHENYLEPHRINE HCL 0.25 % NA SOLN
NASAL | Status: DC | PRN
  Administered 2018-11-06: 2 via NASAL

## 2018-11-06 MED ORDER — FENTANYL CITRATE (PF) 100 MCG/2ML IJ SOLN
INTRAMUSCULAR | Status: AC
  Filled 2018-11-06: qty 4

## 2018-11-06 MED ORDER — FENTANYL CITRATE (PF) 100 MCG/2ML IJ SOLN
INTRAMUSCULAR | Status: DC | PRN
  Administered 2018-11-06 (×2): 50 ug via INTRAVENOUS

## 2018-11-06 MED ORDER — LIDOCAINE HCL 2 % EX GEL
1.0000 "application " | Freq: Once | CUTANEOUS | Status: DC
  Filled 2018-11-06: qty 85

## 2018-11-06 MED ORDER — LIDOCAINE HCL URETHRAL/MUCOSAL 2 % EX GEL
CUTANEOUS | Status: DC | PRN
  Administered 2018-11-06: 1

## 2018-11-06 MED ORDER — MIDAZOLAM HCL (PF) 10 MG/2ML IJ SOLN
INTRAMUSCULAR | Status: DC | PRN
  Administered 2018-11-06 (×5): 1 mg via INTRAVENOUS

## 2018-11-06 MED ORDER — LIDOCAINE HCL (PF) 1 % IJ SOLN
INTRAMUSCULAR | Status: DC | PRN
  Administered 2018-11-06: 6 mL

## 2018-11-06 MED ORDER — PHENYLEPHRINE HCL 0.25 % NA SOLN
1.0000 | Freq: Four times a day (QID) | NASAL | Status: DC | PRN
  Filled 2018-11-06: qty 15

## 2018-11-06 MED ORDER — SODIUM CHLORIDE 0.9 % IV SOLN
INTRAVENOUS | Status: DC
  Administered 2018-11-06: 08:00:00 via INTRAVENOUS

## 2018-11-06 NOTE — Op Note (Signed)
[facility] Cardiopulmonary Patient Name: Dakota Stewart Date: 11/06/2018 MRN: [id number] Attending MD: Laurin Coder MD, MD Date of Birth: 13-Mar-1946 CSN: Finalized Age: 72 Admit Type: Outpatient Gender: Male Procedure:            Bronchoscopy Indications:          Right lower lobe mass Providers:            Salote Weidmann A. Deltha Bernales MD, MD, Ashley Mariner RRT,RCP,                        Christin Fudge Referring MD:          Medicines:            Midazolam 5 mg IV, Fentanyl 740 mcg IV Complications:        No immediate complications Estimated Blood Loss: Estimated blood loss was minimal. Procedure:            Pre-Anesthesia Assessment:                       - Prior to the procedure, a History and Physical was                        performed, and patient medications and allergies were                        reviewed. The patient's tolerance of previous                        anesthesia was also reviewed. The risks and benefits of                        the procedure and the sedation options and risks were                        discussed with the patient. All questions were                        answered, and informed consent was obtained. Prior                        Anticoagulants: The patient has taken no previous                        anticoagulant or antiplatelet agents. ASA Grade                        Assessment: II - A patient with mild systemic disease.                        After reviewing the risks and benefits, the patient was                        deemed in satisfactory condition to undergo the                        procedure.                       - A History and Physical has been performed. The  patient's medications, allergies and sensitivities have                        been reviewed.                       After obtaining informed consent, the bronchoscope was                        passed under direct  vision. Throughout the procedure,                        the patient's blood pressure, pulse, and oxygen                        saturations were monitored continuously. the BF-H190                        ([id number]) Olympus Diagnostic Bronchoscope was                        introduced through the left nostril and advanced to the                        tracheobronchial tree of both lungs. The procedure was                        accomplished without difficulty. The patient tolerated                        the procedure well. Total fluoroscopy time was 12                        seconds. The procedure was accomplished without                        difficulty. The patient tolerated the procedure well. Scope In: 8:38:04 AM Scope Out: 9:07:28 AM Findings:      The nasopharynx/oropharynx appears normal. The larynx appears normal.       The vocal cords appear normal. The subglottic space is normal. The       trachea is of normal caliber. The carina is sharp. The tracheobronchial       tree of the right lung was examined to at least the first subsegmental       level. Bronchial mucosa and anatomy in the right lung are normal; there       was an endobronchial mass noted in RLL lateral segment      biopsy of RLL, washings rll, brushings RLL      Transbronchial biopsies of a mass were performed in the lateral basal       segment of the right lower lobe using forceps and sent for       histopathology examination. The procedure was guided by fluoroscopy.       Transbronchial biopsy technique was selected because the sampling site       was not visible endoscopically. Five biopsy passes were performed. Four       biopsy samples were obtained.      Bronchoalveolar lavage was performed in the RLL lateral basal segment       (B9) of the lung and sent for routine cytology and bacterial,  AFB and       fungal analysis. 80 mL of fluid were instilled. 25 mL were returned. The       return was cellular. There  were no mucoid plugs in the return fluid.      Transbronchial brushings of a mass were obtained in the lateral basal       segment of the right lower lobe with a cytology brush and sent for       routine cytology. One sample was obtained. Transbronchial brushing       technique was selected because the sampling site was not visible       endoscopically. Impression:           - Right lower lobe mass                       - The airway examination of the right lung was normal.                       - Transbronchial lung biopsies were performed.                       - Bronchoalveolar lavage was performed.                       - Transbronchial brushings were obtained.                       - Right lower lobe mass Moderate Sedation:      Moderate (conscious) sedation was personally administered by the       pulmonologist. The following parameters were monitored: oxygen       saturation, heart rate, blood pressure, respiratory rate, EKG, adequacy       of pulmonary ventilation, and response to care. Total physician       intraservice time was 35 minutes. Recommendation:       - Await BAL, biopsy, brushing and cytology results. Procedure Code(s):    --- Professional ---                       [phone], Bronchoscopy, rigid or flexible, including                        fluoroscopic guidance, when performed; with                        transbronchial lung biopsy(s), single lobe                       12197, Bronchoscopy, rigid or flexible, including                        fluoroscopic guidance, when performed; with bronchial                        alveolar lavage                       31623, Bronchoscopy, rigid or flexible, including                        fluoroscopic guidance, when performed; with brushing or  protected brushings                       [id number], Moderate sedation services provided by the same                        physician or other qualified health care  professional                        performing the diagnostic or therapeutic service that                        the sedation supports, requiring the presence of an                        independent trained observer to assist in the                        monitoring of the patient's level of consciousness and                        physiological status; initial 15 minutes of                        intraservice time, patient age 21 years or older                       [phone], Moderate sedation; each additional 15 minutes                        intraservice time Diagnosis Code(s):    --- Professional ---                       R91.8, Other nonspecific abnormal finding of lung field CPT copyright 2018 [employer]. All rights reserved. The codes documented in this report are preliminary and upon coder review may  be revised to meet current compliance requirements. Sherrilyn Rist, MD Laurin Coder MD, MD 11/06/2018 9:54:16 AM This report has been signed electronically. Number of Addenda: 0

## 2018-11-06 NOTE — Brief Op Note (Signed)
PCCM Video Bronchoscopy Procedure Note  The patient was informed of the risks (including but not limited to bleeding, infection, respiratory failure, lung injury, tooth/oral injury) and benefits of the procedure and gave consent, see chart.  Indication: lung mass  Post Procedure Diagnosis: right lung mass  Location: right lower lobe  Condition pre procedure: stable  Medications for procedure: fentanyl-135mg, versed 5 mg  Procedure description: The bronchoscope was introduced through the left nostril and passed to the bilateral lungs to the level of the subsegmental bronchi throughout the tracheobronchial tree.  Airway exam revealed endobronchial lesion right lower lobe lateral segment  Procedures performed: Bronchoscopy with washings in the right lower lobe, brushing right lower lobe, transbronchial biopsies right lower lobe, endobronchial lesion biopsy  Specimens sent: Bronchoalveolar lavage right lower lobe, brushing right lower lobe, transbronchial biopsies right lower lobe, endobronchial biopsies right lower lobe.  Condition post procedure: Stable status  EBL: 20 cc  Complications: No complications

## 2018-11-06 NOTE — Telephone Encounter (Signed)
CXR reviewed- no pneumothorax on CXR

## 2018-11-06 NOTE — Progress Notes (Signed)
Video bronchoscopy performed Intervention bronchial washings Intervention bronchial biopsies Patient tolerated well  Kathie Dike RRT

## 2018-11-06 NOTE — Discharge Instructions (Signed)
Flexible Bronchoscopy, Care After These instructions give you information on caring for yourself after your procedure. Your doctor may also give you more specific instructions. Call your doctor if you have any problems or questions after your procedure. Follow these instructions at home:  Do not eat or drink anything for 2 hours after your procedure. If you try to eat or drink before the medicine wears off, food or drink could go into your lungs. You could also burn yourself.  After 2 hours have passed and when you can cough and gag normally, you may eat soft food and drink liquids slowly.  The day after the test, you may eat your normal diet.  You may do your normal activities.  Keep all doctor visits. Get help right away if:  You get more and more short of breath.  You get light-headed.  You feel like you are going to pass out (faint).  You have chest pain.  You have new problems that worry you.  You cough up more than a little blood.  You cough up more blood than before. This information is not intended to replace advice given to you by your health care provider. Make sure you discuss any questions you have with your health care provider. Document Released: 09/22/2009 Document Revised: 05/02/2016 Document Reviewed: 07/30/2013 Elsevier Interactive Patient Education  2017 [place] not eat or drink until after 11:00 today 11/06/18

## 2018-11-06 NOTE — Interval H&P Note (Signed)
History and Physical Interval Note:  11/06/2018 9:14 AM  Dakota Stewart  has presented today for surgery, with the diagnosis of Abnormal CT  The various methods of treatment have been discussed with the patient and family. After consideration of risks, benefits and other options for treatment, the patient has consented to  Procedure(s): VIDEO BRONCHOSCOPY WITH FLUORO (Bilateral) as a surgical intervention .  The patient's history has been reviewed, patient examined, no change in status, stable for surgery.  I have reviewed the patient's chart and labs.  Questions were answered to the patient's satisfaction.    He feels well today with no significant complaints Agrees to go through with procedure Consent obtained  Rayann Jolley A Abubakr Wieman

## 2018-11-07 LAB — ACID FAST SMEAR (AFB): ACID FAST SMEAR - AFSCU2: NEGATIVE

## 2018-11-07 LAB — ACID FAST SMEAR (AFB, MYCOBACTERIA)

## 2018-11-08 ENCOUNTER — Encounter (HOSPITAL_COMMUNITY): Payer: Self-pay | Admitting: Pulmonary Disease

## 2018-11-08 LAB — CULTURE, BAL-QUANTITATIVE

## 2018-11-08 LAB — CULTURE, BAL-QUANTITATIVE W GRAM STAIN: Culture: 50000 — AB

## 2018-11-10 ENCOUNTER — Telehealth: Payer: Self-pay | Admitting: Internal Medicine

## 2018-11-10 NOTE — Telephone Encounter (Signed)
PC placed to pt today after I received the cytology results of his bronchoscopy that was positive for cells consistent with squamous cell carcinoma.  I received a voice message.  I left a message stating who I am and that I was calling to give the results of pathology from his bronchoscopy.  I left my phone contact and request that he return my call.  I also see that he has an appointment coming up with pulmonary the end of this month.  I left a message telling him to be sure and keep that appointment.

## 2018-11-12 ENCOUNTER — Ambulatory Visit: Payer: Medicare Other | Admitting: Pharmacist

## 2018-11-13 ENCOUNTER — Ambulatory Visit: Payer: Medicare Other | Admitting: Nurse Practitioner

## 2018-11-19 ENCOUNTER — Other Ambulatory Visit (HOSPITAL_COMMUNITY)
Admission: RE | Admit: 2018-11-19 | Discharge: 2018-11-19 | Disposition: A | Discharge status: Home / Self Care | Payer: Medicare Other | Source: Ambulatory Visit | Attending: Internal Medicine | Admitting: Internal Medicine

## 2018-11-19 DIAGNOSIS — C3431 Malignant neoplasm of lower lobe, right bronchus or lung: Secondary | ICD-10-CM | POA: Insufficient documentation

## 2018-11-20 ENCOUNTER — Telehealth: Payer: Self-pay | Admitting: Pulmonary Disease

## 2018-11-20 ENCOUNTER — Telehealth: Payer: Self-pay | Admitting: *Deleted

## 2018-11-20 DIAGNOSIS — C3431 Malignant neoplasm of lower lobe, right bronchus or lung: Secondary | ICD-10-CM

## 2018-11-20 NOTE — Telephone Encounter (Signed)
Referral was made to oncology

## 2018-11-20 NOTE — Telephone Encounter (Signed)
-----   Message from Valrie Hart, RN sent at 11/20/2018  9:37 AM EST ----- Regarding: RE: patinet discussed at cancer conference Sounds great, please make a referral to oncology. Hinton Dyer ----- Message ----- From: Laurin Coder, MD Sent: 11/20/2018   7:14 AM EST To: Valrie Hart, RN Subject: RE: patinet discussed at cancer conference     Thank you for the update,I agree with plan of care He has a daughter that is very involved with his care, she will be needed to ensure that he is compliant through treatment  Sherrilyn Rist, MD ----- Message ----- From: Valrie Hart, RN Sent: 11/19/2018   1:28 PM EST To: Curt Bears, MD, Laurin Coder, MD Subject: patinet discussed at cancer conference         Hi Dr. Ander Slade, I am the thoracic navigator at the cancer center and wanted to update you on cancer conference discussion on Mr. Alford Highland.  Staging looks like at least stage III and recommendations are concurrent chemo rad.  In looking at the clinical notes, it seems you are working with the family to decide the best plan of care for him.  Let me know if I can do something for you. Thanks Hinton Dyer

## 2018-11-20 NOTE — Telephone Encounter (Signed)
Oncology Nurse Navigator Documentation  Oncology Nurse Navigator Flowsheets 11/20/2018  Navigator Location [facility]  Referral date to RadOnc/MedOnc 11/20/2018  Navigator Encounter Type Telephone/I received referral from Dr. Ander Slade.  I called patient's sister per his request.  She updated me that patient passed away on December 14, 2018.  I expressed my condolences and will update Dr. Ander Slade.    Telephone Outgoing Call  Barriers/Navigation Needs Coordination of Care  Interventions Coordination of Care  Coordination of Care Other  Acuity Level 2  Time Spent with Patient 30

## 2018-11-25 ENCOUNTER — Telehealth: Payer: Self-pay | Admitting: *Deleted

## 2018-11-25 NOTE — Telephone Encounter (Signed)
Received D/C from [facility] -D/C forwarded to Dr.Olalere to be signed.

## 2018-11-27 LAB — CULTURE, FUNGUS WITHOUT SMEAR

## 2018-11-30 NOTE — Telephone Encounter (Signed)
11/30/18 I received D/C back from [place] called  [facility] to pick up. I also faxed them a copy to [phone]. PWR

## 2018-12-01 LAB — FUNGAL ORGANISM REFLEX

## 2018-12-01 LAB — FUNGUS CULTURE WITH STAIN

## 2018-12-01 LAB — FUNGUS CULTURE RESULT

## 2018-12-07 ENCOUNTER — Ambulatory Visit: Payer: Medicare Other | Admitting: Pulmonary Disease

## 2018-12-09 DIAGNOSIS — 419620001 Death: Secondary | SNOMED CT | POA: Diagnosis not present

## 2018-12-09 DEATH — deceased

## 2018-12-20 LAB — ACID FAST CULTURE WITH REFLEXED SENSITIVITIES (MYCOBACTERIA): Acid Fast Culture: NEGATIVE

## 2018-12-23 LAB — ACID FAST CULTURE WITH REFLEXED SENSITIVITIES

## 2018-12-23 LAB — ACID FAST CULTURE WITH REFLEXED SENSITIVITIES (MYCOBACTERIA): Acid Fast Culture: NEGATIVE

## 2019-03-01 ENCOUNTER — Ambulatory Visit: Payer: Medicare Other | Admitting: Internal Medicine

## 2019-03-01 ENCOUNTER — Ambulatory Visit: Payer: Medicare Other | Admitting: Family Medicine

## 2019-03-31 NOTE — Telephone Encounter (Signed)
non
